# Patient Record
Sex: Male | Born: 1980
Health system: Southern US, Community
[De-identification: ages and names within clinical notes are randomized; demographics above are authoritative.]

## PROBLEM LIST (undated history)

## (undated) DIAGNOSIS — K219 Gastro-esophageal reflux disease without esophagitis: Secondary | ICD-10-CM

## (undated) DIAGNOSIS — M1990 Unspecified osteoarthritis, unspecified site: Secondary | ICD-10-CM

## (undated) HISTORY — PX: WISDOM TOOTH EXTRACTION: SHX21

---

## 2006-06-24 ENCOUNTER — Emergency Department (HOSPITAL_COMMUNITY): Admission: EM | Admit: 2006-06-24 | Discharge: 2006-06-24 | Payer: Self-pay | Admitting: Emergency Medicine
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
## 2016-10-28 DIAGNOSIS — J329 Chronic sinusitis, unspecified: Secondary | ICD-10-CM | POA: Diagnosis not present

## 2017-03-10 DIAGNOSIS — J019 Acute sinusitis, unspecified: Secondary | ICD-10-CM | POA: Diagnosis not present

## 2017-03-15 DIAGNOSIS — R200 Anesthesia of skin: Secondary | ICD-10-CM | POA: Diagnosis not present

## 2017-03-15 DIAGNOSIS — J329 Chronic sinusitis, unspecified: Secondary | ICD-10-CM | POA: Diagnosis not present

## 2017-03-15 DIAGNOSIS — G500 Trigeminal neuralgia: Secondary | ICD-10-CM | POA: Diagnosis not present

## 2017-03-15 DIAGNOSIS — F17210 Nicotine dependence, cigarettes, uncomplicated: Secondary | ICD-10-CM | POA: Diagnosis not present

## 2017-03-26 DIAGNOSIS — R200 Anesthesia of skin: Secondary | ICD-10-CM | POA: Diagnosis not present

## 2017-03-26 DIAGNOSIS — H6121 Impacted cerumen, right ear: Secondary | ICD-10-CM | POA: Diagnosis not present

## 2017-04-03 DIAGNOSIS — R200 Anesthesia of skin: Secondary | ICD-10-CM | POA: Diagnosis not present

## 2017-08-29 DIAGNOSIS — K649 Unspecified hemorrhoids: Secondary | ICD-10-CM | POA: Diagnosis not present

## 2017-09-06 DIAGNOSIS — Z0000 Encounter for general adult medical examination without abnormal findings: Secondary | ICD-10-CM | POA: Diagnosis not present

## 2017-12-24 DIAGNOSIS — R200 Anesthesia of skin: Secondary | ICD-10-CM | POA: Diagnosis not present

## 2017-12-24 DIAGNOSIS — M5412 Radiculopathy, cervical region: Secondary | ICD-10-CM | POA: Diagnosis not present

## 2017-12-24 DIAGNOSIS — M542 Cervicalgia: Secondary | ICD-10-CM | POA: Diagnosis not present

## 2018-01-05 DIAGNOSIS — M5412 Radiculopathy, cervical region: Secondary | ICD-10-CM | POA: Diagnosis not present

## 2018-01-13 DIAGNOSIS — M542 Cervicalgia: Secondary | ICD-10-CM | POA: Diagnosis not present

## 2018-01-13 DIAGNOSIS — R29898 Other symptoms and signs involving the musculoskeletal system: Secondary | ICD-10-CM | POA: Diagnosis not present

## 2018-01-13 DIAGNOSIS — M4722 Other spondylosis with radiculopathy, cervical region: Secondary | ICD-10-CM | POA: Diagnosis not present

## 2018-01-13 DIAGNOSIS — M5030 Other cervical disc degeneration, unspecified cervical region: Secondary | ICD-10-CM | POA: Diagnosis not present

## 2018-01-13 DIAGNOSIS — M5412 Radiculopathy, cervical region: Secondary | ICD-10-CM | POA: Diagnosis not present

## 2018-01-21 ENCOUNTER — Other Ambulatory Visit: Payer: Self-pay | Admitting: Neurosurgery

## 2018-01-21 DIAGNOSIS — M5412 Radiculopathy, cervical region: Secondary | ICD-10-CM | POA: Diagnosis not present

## 2018-01-21 DIAGNOSIS — M5030 Other cervical disc degeneration, unspecified cervical region: Secondary | ICD-10-CM | POA: Diagnosis not present

## 2018-01-21 DIAGNOSIS — M5020 Other cervical disc displacement, unspecified cervical region: Secondary | ICD-10-CM | POA: Diagnosis not present

## 2018-01-21 DIAGNOSIS — M542 Cervicalgia: Secondary | ICD-10-CM | POA: Diagnosis not present

## 2018-01-21 DIAGNOSIS — M4722 Other spondylosis with radiculopathy, cervical region: Secondary | ICD-10-CM | POA: Diagnosis not present

## 2018-02-05 ENCOUNTER — Other Ambulatory Visit (HOSPITAL_COMMUNITY): Payer: Self-pay

## 2018-02-05 NOTE — Pre-Procedure Instructions (Signed)
Ricardo PernaKevin P Williamson  02/05/2018    Your procedure is scheduled on Thursday, February 12, 2018 at 7:30 AM.   [facility]" Admitting Office at 5:30 AM.   Call this number if you have problems the morning of surgery: [phone]   Questions prior to day of surgery, please call [phone] between 8 & 4 PM.   Remember:  Do not eat food or drink liquids after midnight Wednesday, 02/11/18.  Take these medicines the morning of surgery with A SIP OF WATER: Tylenol - if needed  Stop NSAIDS (Celebrex, Aleve, Ibuprofen, etc) as of today. Do not use Aspirin products prior to surgery.   Do not wear jewelry.  Do not wear lotions, powders, cologne or deodorant.  Men may shave face and neck.  Do not bring valuables to the hospital.  [facility] is not responsible for any belongings or valuables.  Contacts, dentures or bridgework may not be worn into surgery.  Leave your suitcase in the car.  After surgery it may be brought to your room.  For patients admitted to the hospital, discharge time will be determined by your treatment team.  [facility] Pre[facility]  Before surgery, you can play an important role.  Because skin is not sterile, your skin needs to be as free of germs as possible.  You can reduce the number of germs on you skin by washing with CHG (chlorahexidine gluconate) soap before surgery.  CHG is an antiseptic cleaner which kills germs and bonds with the skin to continue killing germs even after washing.  Please DO NOT use if you have an allergy to CHG or antibacterial soaps.  If your skin becomes reddened/irritated stop using the CHG and inform your nurse when you arrive at Short Stay.  Do not shave (including legs and underarms) for at least 48 hours prior to the first CHG shower.  You may shave your face.  Please follow these instructions carefully:   1.  Shower with CHG Soap the night before surgery and the                    morning of  Surgery.  2.  If you choose to wash your hair, wash your hair first as usual with your       normal shampoo.  3.  After you shampoo, rinse your hair and body thoroughly to remove the shampoo.  4.  Use CHG as you would any other liquid soap.  You can apply chg directly       to the skin and wash gently with scrungie or a clean washcloth.  5.  Apply the CHG Soap to your body ONLY FROM THE NECK DOWN.        Do not use on open wounds or open sores.  Avoid contact with your eyes, ears, mouth and genitals (private parts).  Wash genitals (private parts) with your normal soap.  6.  Wash thoroughly, paying special attention to the area where your surgery        will be performed.  7.  Thoroughly rinse your body with warm water from the neck down.  8.  DO NOT shower/wash with your normal soap after using and rinsing off       the CHG Soap.  9.  Pat yourself dry with a clean towel.            10.  Wear clean pajamas.  11.  Place clean sheets on your bed the night of your first shower and do not        sleep with pets.  Day of Surgery  Shower as above. Do not apply any lotions/deodorants the morning of surgery.  Please wear clean clothes to the hospital.   Please read over the fact sheets that you were given.

## 2018-02-06 ENCOUNTER — Encounter (HOSPITAL_COMMUNITY)
Admission: RE | Admit: 2018-02-06 | Discharge: 2018-02-06 | Disposition: A | Discharge status: Home / Self Care | Payer: BLUE CROSS/BLUE SHIELD | Source: Ambulatory Visit | Attending: Neurosurgery | Admitting: Neurosurgery

## 2018-02-06 ENCOUNTER — Other Ambulatory Visit (HOSPITAL_COMMUNITY): Payer: Self-pay | Admitting: *Deleted

## 2018-02-06 ENCOUNTER — Other Ambulatory Visit: Payer: Self-pay

## 2018-02-06 ENCOUNTER — Encounter (HOSPITAL_COMMUNITY): Payer: Self-pay

## 2018-02-06 DIAGNOSIS — M47892 Other spondylosis, cervical region: Secondary | ICD-10-CM | POA: Insufficient documentation

## 2018-02-06 DIAGNOSIS — M5030 Other cervical disc degeneration, unspecified cervical region: Secondary | ICD-10-CM | POA: Diagnosis not present

## 2018-02-06 DIAGNOSIS — Z01818 Encounter for other preprocedural examination: Secondary | ICD-10-CM | POA: Diagnosis not present

## 2018-02-06 DIAGNOSIS — M5020 Other cervical disc displacement, unspecified cervical region: Secondary | ICD-10-CM | POA: Diagnosis not present

## 2018-02-06 HISTORY — DX: Gastro-esophageal reflux disease without esophagitis: K21.9

## 2018-02-06 HISTORY — DX: Unspecified osteoarthritis, unspecified site: M19.90

## 2018-02-06 LAB — COMPREHENSIVE METABOLIC PANEL
ALK PHOS: 70 U/L (ref 38–126)
ALT: 65 U/L — ABNORMAL HIGH (ref 17–63)
ANION GAP: 15 (ref 5–15)
AST: 101 U/L — ABNORMAL HIGH (ref 15–41)
Albumin: 4.6 g/dL (ref 3.5–5.0)
BUN: <5 mg/dL — ABNORMAL LOW (ref 6–20)
CALCIUM: 9 mg/dL (ref 8.9–10.3)
CO2: 21 mmol/L — AB (ref 22–32)
Chloride: 101 mmol/L (ref 101–111)
Creatinine, Ser: 0.73 mg/dL (ref 0.61–1.24)
Glucose, Bld: 91 mg/dL (ref 65–99)
Potassium: 4.1 mmol/L (ref 3.5–5.1)
SODIUM: 137 mmol/L (ref 135–145)
TOTAL PROTEIN: 7.7 g/dL (ref 6.5–8.1)
Total Bilirubin: 0.7 mg/dL (ref 0.3–1.2)

## 2018-02-06 LAB — TYPE AND SCREEN
ABO/RH(D): O POS
Antibody Screen: NEGATIVE

## 2018-02-06 LAB — SURGICAL PCR SCREEN
MRSA, PCR: NEGATIVE
Staphylococcus aureus: NEGATIVE

## 2018-02-06 LAB — CBC
HEMATOCRIT: 44.7 % (ref 39.0–52.0)
Hemoglobin: 15.3 g/dL (ref 13.0–17.0)
MCH: 32.3 pg (ref 26.0–34.0)
MCHC: 34.2 g/dL (ref 30.0–36.0)
MCV: 94.5 fL (ref 78.0–100.0)
PLATELETS: 208 10*3/uL (ref 150–400)
RBC: 4.73 MIL/uL (ref 4.22–5.81)
RDW: 13.7 % (ref 11.5–15.5)
WBC: 7.7 10*3/uL (ref 4.0–10.5)

## 2018-02-06 LAB — ABO/RH: ABO/RH(D): O POS

## 2018-02-06 NOTE — Progress Notes (Signed)
Pt and wife deny cardiac history, HTN or Diabetes. Pt very hyper, nervous in PAT appt. No known history of ADD or ADHD. Pt does state he drinks beer daily, 2-3 daily during the week and can be 6 a day on weekends.

## 2018-02-09 NOTE — Progress Notes (Addendum)
Anesthesia Chart Review:  Pt is a 37 year old male scheduled for C4-5, C5-6, C6-7 ACDF on 02/12/2018 with Jovita Gamma, MD  - PCP is Townsend Roger, MD  PMH includes:  GERD. Current smoker. BMI 22  Medications include: zantac  BP 122/67   Pulse 65   Temp 36.5 C   Resp 20   Ht '5\' 11"'$  (1.803 m)   Wt 156 lb 1.6 oz (70.8 kg)   SpO2 100%   BMI 21.77 kg/m   Preoperative labs reviewed.   - AST 101, ALT 65. Alk phos normal. Pt reportedly drinks 3 beers per day.  - Platelets normal - Reviewed labs will Dr. Valma Cava. No comparison labs available. Will repeat HFP and get coags day of surgery. I notified Nikki in Dr. Donnella Bi office and will route lab results to PCP.   EKG not required based on history.   If no changes, I anticipate pt can proceed with surgery as scheduled.   Willeen Cass, FNP-BC [facility] Surgical [facility]/Anesthesiology Phone: [phone] 02/09/2018 2:54 PM

## 2018-02-10 ENCOUNTER — Other Ambulatory Visit: Payer: Self-pay | Admitting: Neurosurgery

## 2018-02-12 ENCOUNTER — Encounter (HOSPITAL_COMMUNITY): Payer: Self-pay | Admitting: *Deleted

## 2018-02-12 ENCOUNTER — Inpatient Hospital Stay (HOSPITAL_COMMUNITY): Payer: BLUE CROSS/BLUE SHIELD

## 2018-02-12 ENCOUNTER — Inpatient Hospital Stay (HOSPITAL_COMMUNITY): Payer: BLUE CROSS/BLUE SHIELD | Admitting: Emergency Medicine

## 2018-02-12 ENCOUNTER — Inpatient Hospital Stay (HOSPITAL_COMMUNITY)
Admission: RE | Disposition: A | Discharge status: Home / Self Care | Payer: Self-pay | Source: Ambulatory Visit | Attending: Neurosurgery

## 2018-02-12 ENCOUNTER — Inpatient Hospital Stay (HOSPITAL_COMMUNITY)
Admission: RE | Admit: 2018-02-12 | Discharge: 2018-02-13 | DRG: 473 | Disposition: A | Discharge status: Home / Self Care | Payer: BLUE CROSS/BLUE SHIELD | Source: Ambulatory Visit | Attending: Neurosurgery | Admitting: Neurosurgery

## 2018-02-12 ENCOUNTER — Other Ambulatory Visit: Payer: Self-pay

## 2018-02-12 DIAGNOSIS — M50121 Cervical disc disorder at C4-C5 level with radiculopathy: Principal | ICD-10-CM | POA: Diagnosis present

## 2018-02-12 DIAGNOSIS — M4722 Other spondylosis with radiculopathy, cervical region: Secondary | ICD-10-CM | POA: Diagnosis present

## 2018-02-12 DIAGNOSIS — M6281 Muscle weakness (generalized): Secondary | ICD-10-CM | POA: Diagnosis present

## 2018-02-12 DIAGNOSIS — Z791 Long term (current) use of non-steroidal anti-inflammatories (NSAID): Secondary | ICD-10-CM | POA: Diagnosis not present

## 2018-02-12 DIAGNOSIS — M50122 Cervical disc disorder at C5-C6 level with radiculopathy: Secondary | ICD-10-CM | POA: Diagnosis not present

## 2018-02-12 DIAGNOSIS — M4322 Fusion of spine, cervical region: Secondary | ICD-10-CM | POA: Diagnosis not present

## 2018-02-12 DIAGNOSIS — K219 Gastro-esophageal reflux disease without esophagitis: Secondary | ICD-10-CM | POA: Diagnosis present

## 2018-02-12 DIAGNOSIS — M5020 Other cervical disc displacement, unspecified cervical region: Secondary | ICD-10-CM | POA: Diagnosis present

## 2018-02-12 DIAGNOSIS — F17210 Nicotine dependence, cigarettes, uncomplicated: Secondary | ICD-10-CM | POA: Diagnosis not present

## 2018-02-12 DIAGNOSIS — F17290 Nicotine dependence, other tobacco product, uncomplicated: Secondary | ICD-10-CM | POA: Diagnosis not present

## 2018-02-12 DIAGNOSIS — Z419 Encounter for procedure for purposes other than remedying health state, unspecified: Secondary | ICD-10-CM

## 2018-02-12 DIAGNOSIS — Z981 Arthrodesis status: Secondary | ICD-10-CM | POA: Diagnosis not present

## 2018-02-12 DIAGNOSIS — Z79899 Other long term (current) drug therapy: Secondary | ICD-10-CM | POA: Diagnosis not present

## 2018-02-12 DIAGNOSIS — M50123 Cervical disc disorder at C6-C7 level with radiculopathy: Secondary | ICD-10-CM | POA: Diagnosis not present

## 2018-02-12 HISTORY — PX: ANTERIOR CERVICAL DECOMP/DISCECTOMY FUSION: SHX1161

## 2018-02-12 LAB — HEPATIC FUNCTION PANEL
ALT: 46 U/L (ref 17–63)
AST: 62 U/L — AB (ref 15–41)
Albumin: 4.4 g/dL (ref 3.5–5.0)
Alkaline Phosphatase: 67 U/L (ref 38–126)
BILIRUBIN DIRECT: 0.1 mg/dL (ref 0.1–0.5)
BILIRUBIN TOTAL: 0.6 mg/dL (ref 0.3–1.2)
Indirect Bilirubin: 0.5 mg/dL (ref 0.3–0.9)
Total Protein: 7.2 g/dL (ref 6.5–8.1)

## 2018-02-12 LAB — PROTIME-INR
INR: 0.92
Prothrombin Time: 12.3 seconds (ref 11.4–15.2)

## 2018-02-12 LAB — APTT: aPTT: 29 seconds (ref 24–36)

## 2018-02-12 SURGERY — ANTERIOR CERVICAL DECOMPRESSION/DISCECTOMY FUSION 3 LEVELS
Anesthesia: General

## 2018-02-12 MED ORDER — PROPOFOL 10 MG/ML IV BOLUS
INTRAVENOUS | Status: DC | PRN
  Administered 2018-02-12: 200 mg via INTRAVENOUS

## 2018-02-12 MED ORDER — BUPIVACAINE HCL (PF) 0.5 % IJ SOLN
INTRAMUSCULAR | Status: DC | PRN
  Administered 2018-02-12: 10 mL

## 2018-02-12 MED ORDER — LACTATED RINGERS IV SOLN
INTRAVENOUS | Status: DC | PRN
  Administered 2018-02-12: 08:00:00 via INTRAVENOUS

## 2018-02-12 MED ORDER — FENTANYL CITRATE (PF) 250 MCG/5ML IJ SOLN
INTRAMUSCULAR | Status: AC
  Filled 2018-02-12: qty 5

## 2018-02-12 MED ORDER — MORPHINE SULFATE (PF) 4 MG/ML IV SOLN: 4.0000 mg | INTRAVENOUS | Status: DC | PRN

## 2018-02-12 MED ORDER — CEFAZOLIN SODIUM-DEXTROSE 2-4 GM/100ML-% IV SOLN
INTRAVENOUS | Status: AC
  Filled 2018-02-12: qty 100

## 2018-02-12 MED ORDER — ACETAMINOPHEN 650 MG RE SUPP: 650.0000 mg | RECTAL | Status: DC | PRN

## 2018-02-12 MED ORDER — MIDAZOLAM HCL 2 MG/2ML IJ SOLN
INTRAMUSCULAR | Status: AC
  Filled 2018-02-12: qty 2

## 2018-02-12 MED ORDER — KETOROLAC TROMETHAMINE 30 MG/ML IJ SOLN
30.0000 mg | Freq: Once | INTRAMUSCULAR | Status: AC
  Administered 2018-02-12: 30 mg via INTRAVENOUS

## 2018-02-12 MED ORDER — CHLORHEXIDINE GLUCONATE CLOTH 2 % EX PADS: 6.0000 | MEDICATED_PAD | Freq: Once | CUTANEOUS | Status: DC

## 2018-02-12 MED ORDER — LIDOCAINE-EPINEPHRINE 1 %-1:100000 IJ SOLN
INTRAMUSCULAR | Status: AC
  Filled 2018-02-12: qty 1

## 2018-02-12 MED ORDER — DEXAMETHASONE SODIUM PHOSPHATE 10 MG/ML IJ SOLN
INTRAMUSCULAR | Status: DC | PRN
  Administered 2018-02-12: 10 mg via INTRAVENOUS

## 2018-02-12 MED ORDER — LIDOCAINE 2% (20 MG/ML) 5 ML SYRINGE
INTRAMUSCULAR | Status: DC | PRN
  Administered 2018-02-12: 80 mg via INTRAVENOUS

## 2018-02-12 MED ORDER — HYDROCODONE-ACETAMINOPHEN 5-325 MG PO TABS
1.0000 | ORAL_TABLET | ORAL | Status: DC | PRN
  Administered 2018-02-13: 2 via ORAL
  Filled 2018-02-12: qty 2

## 2018-02-12 MED ORDER — MAGNESIUM HYDROXIDE 400 MG/5ML PO SUSP: 30.0000 mL | Freq: Every day | ORAL | Status: DC | PRN

## 2018-02-12 MED ORDER — SUGAMMADEX SODIUM 200 MG/2ML IV SOLN
INTRAVENOUS | Status: DC | PRN
  Administered 2018-02-12: 200 mg via INTRAVENOUS

## 2018-02-12 MED ORDER — MIDAZOLAM HCL 5 MG/5ML IJ SOLN
INTRAMUSCULAR | Status: DC | PRN
  Administered 2018-02-12: 2 mg via INTRAVENOUS

## 2018-02-12 MED ORDER — OXYCODONE HCL 5 MG/5ML PO SOLN: 5.0000 mg | Freq: Once | ORAL | Status: DC | PRN

## 2018-02-12 MED ORDER — ALUM & MAG HYDROXIDE-SIMETH 200-200-20 MG/5ML PO SUSP: 30.0000 mL | Freq: Four times a day (QID) | ORAL | Status: DC | PRN

## 2018-02-12 MED ORDER — ONDANSETRON HCL 4 MG/2ML IJ SOLN: 4.0000 mg | Freq: Four times a day (QID) | INTRAMUSCULAR | Status: DC | PRN

## 2018-02-12 MED ORDER — SODIUM CHLORIDE 0.9% FLUSH
3.0000 mL | Freq: Two times a day (BID) | INTRAVENOUS | Status: DC
  Administered 2018-02-12: 3 mL via INTRAVENOUS

## 2018-02-12 MED ORDER — THROMBIN (RECOMBINANT) 5000 UNITS EX SOLR
CUTANEOUS | Status: DC | PRN
  Administered 2018-02-12: 08:00:00 via TOPICAL

## 2018-02-12 MED ORDER — BISACODYL 10 MG RE SUPP: 10.0000 mg | Freq: Every day | RECTAL | Status: DC | PRN

## 2018-02-12 MED ORDER — HYDROXYZINE HCL 25 MG PO TABS: 50.0000 mg | ORAL_TABLET | ORAL | Status: DC | PRN

## 2018-02-12 MED ORDER — 0.9 % SODIUM CHLORIDE (POUR BTL) OPTIME
TOPICAL | Status: DC | PRN
  Administered 2018-02-12: 1000 mL

## 2018-02-12 MED ORDER — ROCURONIUM BROMIDE 10 MG/ML (PF) SYRINGE
PREFILLED_SYRINGE | INTRAVENOUS | Status: DC | PRN
  Administered 2018-02-12: 30 mg via INTRAVENOUS
  Administered 2018-02-12 (×2): 50 mg via INTRAVENOUS
  Administered 2018-02-12: 30 mg via INTRAVENOUS

## 2018-02-12 MED ORDER — SUGAMMADEX SODIUM 200 MG/2ML IV SOLN
INTRAVENOUS | Status: AC
  Filled 2018-02-12: qty 2

## 2018-02-12 MED ORDER — CEFAZOLIN SODIUM-DEXTROSE 2-4 GM/100ML-% IV SOLN
2.0000 g | INTRAVENOUS | Status: AC
  Administered 2018-02-12: 2 g via INTRAVENOUS

## 2018-02-12 MED ORDER — FLEET ENEMA 7-19 GM/118ML RE ENEM: 1.0000 | ENEMA | Freq: Once | RECTAL | Status: DC | PRN

## 2018-02-12 MED ORDER — SODIUM CHLORIDE 0.9 % IV SOLN: 250.0000 mL | INTRAVENOUS | Status: DC

## 2018-02-12 MED ORDER — LIDOCAINE-EPINEPHRINE 1 %-1:100000 IJ SOLN
INTRAMUSCULAR | Status: DC | PRN
  Administered 2018-02-12: 10 mL

## 2018-02-12 MED ORDER — FENTANYL CITRATE (PF) 250 MCG/5ML IJ SOLN
INTRAMUSCULAR | Status: DC | PRN
  Administered 2018-02-12 (×5): 50 ug via INTRAVENOUS
  Administered 2018-02-12: 100 ug via INTRAVENOUS
  Administered 2018-02-12 (×3): 50 ug via INTRAVENOUS

## 2018-02-12 MED ORDER — DEXAMETHASONE SODIUM PHOSPHATE 10 MG/ML IJ SOLN
INTRAMUSCULAR | Status: AC
  Filled 2018-02-12: qty 1

## 2018-02-12 MED ORDER — THROMBIN 5000 UNITS EX SOLR
CUTANEOUS | Status: AC
  Filled 2018-02-12: qty 5000

## 2018-02-12 MED ORDER — SODIUM CHLORIDE 0.9% FLUSH: 3.0000 mL | INTRAVENOUS | Status: DC | PRN

## 2018-02-12 MED ORDER — PHENYLEPHRINE 40 MCG/ML (10ML) SYRINGE FOR IV PUSH (FOR BLOOD PRESSURE SUPPORT)
PREFILLED_SYRINGE | INTRAVENOUS | Status: AC
  Filled 2018-02-12: qty 10

## 2018-02-12 MED ORDER — ONDANSETRON HCL 4 MG/2ML IJ SOLN
INTRAMUSCULAR | Status: DC | PRN
  Administered 2018-02-12: 4 mg via INTRAVENOUS

## 2018-02-12 MED ORDER — PHENOL 1.4 % MT LIQD
1.0000 | OROMUCOSAL | Status: DC | PRN
  Administered 2018-02-13: 1 via OROMUCOSAL
  Filled 2018-02-12: qty 177

## 2018-02-12 MED ORDER — ROCURONIUM BROMIDE 10 MG/ML (PF) SYRINGE
PREFILLED_SYRINGE | INTRAVENOUS | Status: AC
  Filled 2018-02-12: qty 5

## 2018-02-12 MED ORDER — PROPOFOL 10 MG/ML IV BOLUS
INTRAVENOUS | Status: AC
  Filled 2018-02-12: qty 20

## 2018-02-12 MED ORDER — KETOROLAC TROMETHAMINE 30 MG/ML IJ SOLN
30.0000 mg | Freq: Four times a day (QID) | INTRAMUSCULAR | Status: DC
  Administered 2018-02-12 – 2018-02-13 (×3): 30 mg via INTRAVENOUS
  Filled 2018-02-12 (×3): qty 1

## 2018-02-12 MED ORDER — OXYCODONE HCL 5 MG PO TABS
5.0000 mg | ORAL_TABLET | Freq: Once | ORAL | Status: DC | PRN
Start: 2018-02-12 — End: 2018-02-12

## 2018-02-12 MED ORDER — ACETAMINOPHEN 325 MG PO TABS: 650.0000 mg | ORAL_TABLET | ORAL | Status: DC | PRN

## 2018-02-12 MED ORDER — PHENYLEPHRINE HCL 10 MG/ML IJ SOLN: INTRAMUSCULAR | Status: DC | PRN

## 2018-02-12 MED ORDER — HYDROMORPHONE HCL 2 MG/ML IJ SOLN: 0.3000 mg | INTRAMUSCULAR | Status: DC | PRN

## 2018-02-12 MED ORDER — FENTANYL CITRATE (PF) 250 MCG/5ML IJ SOLN
INTRAMUSCULAR | Status: AC
Start: 2018-02-12 — End: 2018-02-12
  Filled 2018-02-12: qty 5

## 2018-02-12 MED ORDER — DEXTROSE 5 % IV SOLN
INTRAVENOUS | Status: DC | PRN
  Administered 2018-02-12: 25 ug/min via INTRAVENOUS

## 2018-02-12 MED ORDER — KETOROLAC TROMETHAMINE 30 MG/ML IJ SOLN
INTRAMUSCULAR | Status: AC
  Filled 2018-02-12: qty 1

## 2018-02-12 MED ORDER — ACETAMINOPHEN 10 MG/ML IV SOLN
INTRAVENOUS | Status: DC | PRN
  Administered 2018-02-12: 1000 mg via INTRAVENOUS

## 2018-02-12 MED ORDER — ACETAMINOPHEN 10 MG/ML IV SOLN
INTRAVENOUS | Status: AC
  Filled 2018-02-12: qty 100

## 2018-02-12 MED ORDER — PHENYLEPHRINE 40 MCG/ML (10ML) SYRINGE FOR IV PUSH (FOR BLOOD PRESSURE SUPPORT)
PREFILLED_SYRINGE | INTRAVENOUS | Status: DC | PRN
  Administered 2018-02-12: 30 ug via INTRAVENOUS
  Administered 2018-02-12 (×2): 80 ug via INTRAVENOUS

## 2018-02-12 MED ORDER — KCL IN DEXTROSE-NACL 20-5-0.45 MEQ/L-%-% IV SOLN: INTRAVENOUS | Status: DC

## 2018-02-12 MED ORDER — LACTATED RINGERS IV SOLN
INTRAVENOUS | Status: DC | PRN
  Administered 2018-02-12 (×2): via INTRAVENOUS

## 2018-02-12 MED ORDER — BUPIVACAINE HCL (PF) 0.5 % IJ SOLN
INTRAMUSCULAR | Status: AC
  Filled 2018-02-12: qty 30

## 2018-02-12 MED ORDER — SURGIFOAM 100 EX MISC
CUTANEOUS | Status: DC | PRN
  Administered 2018-02-12: 09:00:00 via TOPICAL

## 2018-02-12 MED ORDER — MENTHOL 3 MG MT LOZG: 1.0000 | LOZENGE | OROMUCOSAL | Status: DC | PRN

## 2018-02-12 MED ORDER — HYDROXYZINE HCL 50 MG/ML IM SOLN: 50.0000 mg | INTRAMUSCULAR | Status: DC | PRN

## 2018-02-12 MED ORDER — ONDANSETRON HCL 4 MG/2ML IJ SOLN
INTRAMUSCULAR | Status: AC
  Filled 2018-02-12: qty 2

## 2018-02-12 MED ORDER — LIDOCAINE 2% (20 MG/ML) 5 ML SYRINGE
INTRAMUSCULAR | Status: AC
  Filled 2018-02-12: qty 5

## 2018-02-12 MED ORDER — SODIUM CHLORIDE 0.9 % IV SOLN
INTRAVENOUS | Status: DC | PRN
  Administered 2018-02-12: 08:00:00

## 2018-02-12 MED ORDER — CYCLOBENZAPRINE HCL 5 MG PO TABS: 5.0000 mg | ORAL_TABLET | Freq: Three times a day (TID) | ORAL | Status: DC | PRN

## 2018-02-12 MED ORDER — THROMBIN 20000 UNITS EX SOLR
CUTANEOUS | Status: AC
  Filled 2018-02-12: qty 20000

## 2018-02-12 SURGICAL SUPPLY — 66 items
ADH SKN CLS APL DERMABOND .7 (GAUZE/BANDAGES/DRESSINGS) ×1
ALLOGRAFT 7X14X11 (Bone Implant) ×3 IMPLANT
BAG DECANTER FOR FLEXI CONT (MISCELLANEOUS) ×2 IMPLANT
BIT DRILL 13 (BIT) ×1 IMPLANT
BIT DRILL NEURO 2X3.1 SFT TUCH (MISCELLANEOUS) ×1 IMPLANT
BLADE ULTRA TIP 2M (BLADE) IMPLANT
BUR MATCHSTICK NEURO 3.0 LAGG (BURR) IMPLANT
CANISTER SUCT 3000ML PPV (MISCELLANEOUS) ×2 IMPLANT
CARTRIDGE OIL MAESTRO DRILL (MISCELLANEOUS) ×1 IMPLANT
CLIP VESOCCLUDE MED 6/CT (CLIP) ×1 IMPLANT
COVER MAYO STAND STRL (DRAPES) ×2 IMPLANT
DECANTER SPIKE VIAL GLASS SM (MISCELLANEOUS) ×2 IMPLANT
DERMABOND ADVANCED (GAUZE/BANDAGES/DRESSINGS) ×1
DERMABOND ADVANCED .7 DNX12 (GAUZE/BANDAGES/DRESSINGS) ×1 IMPLANT
DIFFUSER DRILL AIR PNEUMATIC (MISCELLANEOUS) ×2 IMPLANT
DRAPE HALF SHEET 40X57 (DRAPES) IMPLANT
DRAPE LAPAROTOMY 100X72 PEDS (DRAPES) ×2 IMPLANT
DRAPE MICROSCOPE LEICA (MISCELLANEOUS) ×2 IMPLANT
DRAPE POUCH INSTRU U-SHP 10X18 (DRAPES) ×2 IMPLANT
DRILL NEURO 2X3.1 SOFT TOUCH (MISCELLANEOUS) ×4
ELECT COATED BLADE 2.86 ST (ELECTRODE) ×2 IMPLANT
ELECT REM PT RETURN 9FT ADLT (ELECTROSURGICAL) ×2
ELECTRODE REM PT RTRN 9FT ADLT (ELECTROSURGICAL) ×1 IMPLANT
GAUZE SPONGE 4X4 12PLY STRL LF (GAUZE/BANDAGES/DRESSINGS) IMPLANT
GAUZE SPONGE 4X4 16PLY XRAY LF (GAUZE/BANDAGES/DRESSINGS) ×1 IMPLANT
GLOVE BIOGEL PI IND STRL 8 (GLOVE) ×1 IMPLANT
GLOVE BIOGEL PI INDICATOR 8 (GLOVE) ×2
GLOVE ECLIPSE 7.5 STRL STRAW (GLOVE) ×3 IMPLANT
GLOVE EXAM NITRILE LRG STRL (GLOVE) IMPLANT
GLOVE EXAM NITRILE XL STR (GLOVE) IMPLANT
GLOVE EXAM NITRILE XS STR PU (GLOVE) IMPLANT
GOWN STRL REUS W/ TWL LRG LVL3 (GOWN DISPOSABLE) IMPLANT
GOWN STRL REUS W/ TWL XL LVL3 (GOWN DISPOSABLE) ×1 IMPLANT
GOWN STRL REUS W/TWL 2XL LVL3 (GOWN DISPOSABLE) ×1 IMPLANT
GOWN STRL REUS W/TWL LRG LVL3 (GOWN DISPOSABLE) ×2
GOWN STRL REUS W/TWL XL LVL3 (GOWN DISPOSABLE) ×2
HALTER HD/CHIN CERV TRACTION D (MISCELLANEOUS) ×2 IMPLANT
HEMOSTAT POWDER KIT SURGIFOAM (HEMOSTASIS) ×2 IMPLANT
KIT BASIN OR (CUSTOM PROCEDURE TRAY) ×2 IMPLANT
KIT TURNOVER KIT B (KITS) ×2 IMPLANT
NDL HYPO 25X1 1.5 SAFETY (NEEDLE) ×1 IMPLANT
NDL SPNL 22GX3.5 QUINCKE BK (NEEDLE) ×1 IMPLANT
NEEDLE HYPO 25X1 1.5 SAFETY (NEEDLE) ×2 IMPLANT
NEEDLE SPNL 22GX3.5 QUINCKE BK (NEEDLE) ×2 IMPLANT
NS IRRIG 1000ML POUR BTL (IV SOLUTION) ×2 IMPLANT
OIL CARTRIDGE MAESTRO DRILL (MISCELLANEOUS) ×2
PACK LAMINECTOMY NEURO (CUSTOM PROCEDURE TRAY) ×2 IMPLANT
PAD ARMBOARD 7.5X6 YLW CONV (MISCELLANEOUS) ×6 IMPLANT
PATTIES SURGICAL 1X1 (DISPOSABLE) ×1 IMPLANT
PLATE 3 57.5XLCK NS SPNE CVD (Plate) IMPLANT
PLATE 3 ATLANTIS TRANS (Plate) ×2 IMPLANT
RUBBERBAND STERILE (MISCELLANEOUS) ×4 IMPLANT
SCREW ST 15X4XST FXANG NS (Screw) IMPLANT
SCREW ST 16X4XST FXANG NS (Screw) IMPLANT
SCREW ST FIX 4 ATL (Screw) ×16 IMPLANT
SPONGE INTESTINAL PEANUT (DISPOSABLE) ×3 IMPLANT
SPONGE SURGIFOAM ABS GEL 100 (HEMOSTASIS) ×2 IMPLANT
STAPLER SKIN PROX WIDE 3.9 (STAPLE) ×1 IMPLANT
SUT VIC AB 0 CT1 18XCR BRD8 (SUTURE) IMPLANT
SUT VIC AB 0 CT1 8-18 (SUTURE)
SUT VIC AB 2-0 CP2 18 (SUTURE) ×2 IMPLANT
SUT VIC AB 3-0 SH 8-18 (SUTURE) ×2 IMPLANT
TAPE CLOTH SURG 4X10 WHT LF (GAUZE/BANDAGES/DRESSINGS) ×1 IMPLANT
TOWEL GREEN STERILE (TOWEL DISPOSABLE) ×2 IMPLANT
TOWEL GREEN STERILE FF (TOWEL DISPOSABLE) ×1 IMPLANT
WATER STERILE IRR 1000ML POUR (IV SOLUTION) ×2 IMPLANT

## 2018-02-12 NOTE — Op Note (Signed)
02/12/2018  11:28 AM  PATIENT:  Ricardo Williamson  37 y.o. male  PRE-OPERATIVE DIAGNOSIS:  Multilevel cervical disc herniation superimposed upon underlying cervical spondylosis and degenerative disc disease with mild cervical radiculopathy, left worse than right with left triceps weakness  POST-OPERATIVE DIAGNOSIS:  Multilevel cervical disc herniation superimposed upon underlying cervical spondylosis and degenerative disc disease with mild cervical radiculopathy, left worse than right with left triceps weakness  PROCEDURE:  Procedure(s): C4-5, C5-6, and C6-7 anterior cervical decompression and arthrodesis with structural allograft and Atlantis translational cervical plating  SURGEON: Shirlean Kelly, MD  ASSISTANTS: Coletta Memos, MD  ANESTHESIA:   general  EBL:  Total I/O In: 1900 [I.V.:1900] Out: 450 [Urine:300; Blood:150]  BLOOD ADMINISTERED:none  COUNT:  Correct per nursing staff  DICTATION: Patient was brought to the operating room placed under general endotracheal anesthesia. Patient was placed in 10 pounds of halter traction. The neck was prepped with Betadine soap and solution and draped in a sterile fashion. An obliquel incision was made on the left side of the neck paralleling the anterior border of the sternocleidomastoid.. The line of the incision was infiltrated with local anesthetic with epinephrine. Dissection was carried down thru the subcutaneous tissue and platysma, bipolar cautery was used to maintain hemostasis. Dissection was then carried out thru a plane leaving the sternocleidomastoid carotid artery and jugular vein laterally and the trachea and esophagus medially. The ventral aspect of the vertebral column was identified and a localizing x-ray was taken. The C4-5, C5-6, C6-7 levels were identified. The annulus at each level was incised and the disc space entered.  There was extensive anterior spinal overgrowth, particularly at the C6-7 level, that was carefully removed  with Kerrison punches, the osteophyte removal tool, and the high-speed drill.  Discectomy was performed with micro-curettes and pituitary rongeurs. The operating microscope was draped and brought into the field provided additional magnification illumination and visualization. Discectomy was continued posteriorly thru the disc space and then the cartilaginous endplate was removed using micro-curettes along with the high-speed drill. Posterior osteophytic overgrowth was removed at each level using the high-speed drill along with a 2 mm thin footplated Kerrison punch. Posterior longitudinal ligament along with disc herniation was carefully removed, decompressing the spinal canal and thecal sac. We then continued to remove osteophytic overgrowth and disc material decompressing the neural foramina and exiting nerve roots bilaterally. Once the decompression was completed hemostasis was established at each level with the use of Gelfoam with thrombin and bipolar cautery. The Gelfoam was removed, a thin layer of Surgifoam applied, the wound irrigated and hemostasis confirmed. We then measured the height of each intravertebral disc space level and selected a 7 millimeter in height structural allograft for each level. Each was hydrated in saline solution and then gently positioned in the intravertebral disc space and countersunk. We then selected a 57.5 millimeter in height Atlantis translational cervical plate. It was positioned over the fusion construct and secured to the vertebra with 4 mm fixed screws.  We used a pair of 15 mm screws bilaterally at C4 and C6 and a pair of 16 mm screws bilaterally at C5 and C7.  Each screw hole was started with the high-speed drill and then the screws placed, once all the screws were placed, the locking system was secured. The wound was irrigated with bacitracin solution checked for hemostasis which was established and confirmed. An x-ray was taken which showed the grafts in good position,  the plate and screws in good position, and the overall  alignment to be good. We then proceeded with closure. The platysma was closed with interrupted inverted 2-0 undyed Vicryl suture, the subcutaneous and subcuticular closed with interrupted inverted 3-0 undyed Vicryl suture. The skin edges were approximated with Dermabond. Following surgery the patient was taken out of cervical traction. To be reversed and the anesthetic and taken to the recovery room for further care.  PLAN OF CARE: Admit to inpatient   PATIENT DISPOSITION:  PACU - hemodynamically stable.   Delay start of Pharmacological VTE agent (>24hrs) due to surgical blood loss or risk of bleeding:  yes

## 2018-02-12 NOTE — Anesthesia Procedure Notes (Addendum)
Procedure Name: Intubation Date/Time: 02/12/2018 7:39 AM Performed by: Nils PyleBell, Jahnasia Tatum T, CRNA Pre-anesthesia Checklist: Patient identified, Emergency Drugs available, Suction available, Patient being monitored and Timeout performed Patient Re-evaluated:Patient Re-evaluated prior to induction Oxygen Delivery Method: Circle system utilized Preoxygenation: Pre-oxygenation with 100% oxygen Induction Type: IV induction Ventilation: Mask ventilation without difficulty Laryngoscope Size: Glidescope and 4 Grade View: Grade I Tube type: Oral Tube size: 7.5 mm Number of attempts: 1 Airway Equipment and Method: Patient positioned with wedge pillow,  Stylet and Video-laryngoscopy Placement Confirmation: ETT inserted through vocal cords under direct vision,  positive ETCO2 and breath sounds checked- equal and bilateral Secured at: 23 cm Tube secured with: Tape Dental Injury: Teeth and Oropharynx as per pre-operative assessment  Comments: Intubation by Eldridge AbrahamsSally Nelson, SRNA. Elective glidescope intubation due to cervical pain.

## 2018-02-12 NOTE — Anesthesia Preprocedure Evaluation (Addendum)
Anesthesia Evaluation  Patient identified by MRN, date of birth, ID band Patient awake    Reviewed: Allergy & Precautions, H&P , NPO status , Patient's Chart, lab work & pertinent test results  Airway Mallampati: II  TM Distance: >3 FB Neck ROM: full    Dental  (+) Teeth Intact, Dental Advisory Given   Pulmonary Current Smoker,    breath sounds clear to auscultation       Cardiovascular negative cardio ROS   Rhythm:regular Rate:Normal     Neuro/Psych    GI/Hepatic GERD  ,  Endo/Other    Renal/GU      Musculoskeletal  (+) Arthritis ,   Abdominal   Peds  Hematology   Anesthesia Other Findings   Reproductive/Obstetrics                            Anesthesia Physical Anesthesia Plan  ASA: II  Anesthesia Plan: General   Post-op Pain Management:    Induction: Intravenous  PONV Risk Score and Plan: 1 and Ondansetron, Dexamethasone, Midazolam and Treatment may vary due to age or medical condition  Airway Management Planned: Oral ETT  Additional Equipment:   Intra-op Plan:   Post-operative Plan: Extubation in OR  Informed Consent: I have reviewed the patients History and Physical, chart, labs and discussed the procedure including the risks, benefits and alternatives for the proposed anesthesia with the patient or authorized representative who has indicated his/her understanding and acceptance.     Plan Discussed with: CRNA, Anesthesiologist and Surgeon  Anesthesia Plan Comments:         Anesthesia Quick Evaluation

## 2018-02-12 NOTE — Anesthesia Postprocedure Evaluation (Signed)
Anesthesia Post Note  Patient: Ricardo PernaKevin P Williamson  Procedure(s) Performed: ANTERIOR CERVICAL DECOMPRESSION/DISCECTOMY FUSIONCERVICAL FOUR-FIVE, FIVE-SIX, SIX-SEVEN (N/A )     Patient location during evaluation: PACU Anesthesia Type: General Level of consciousness: awake and alert Pain management: pain level controlled Vital Signs Assessment: post-procedure vital signs reviewed and stable Respiratory status: spontaneous breathing, nonlabored ventilation, respiratory function stable and patient connected to nasal cannula oxygen Cardiovascular status: blood pressure returned to baseline and stable Postop Assessment: no apparent nausea or vomiting Anesthetic complications: no    Last Vitals:  Vitals:   02/12/18 1630 02/12/18 1700  BP: 134/83   Pulse: 95   Resp: 18   Temp: 37.5 C 37.1 C  SpO2: 98%     Last Pain:  Vitals:   02/12/18 1700  TempSrc: Oral  PainSc:                  Satsuki Zillmer S

## 2018-02-12 NOTE — Transfer of Care (Signed)
Immediate Anesthesia Transfer of Care Note  Patient: Ricardo PernaKevin P Williamson  Procedure(s) Performed: ANTERIOR CERVICAL DECOMPRESSION/DISCECTOMY FUSIONCERVICAL FOUR-FIVE, FIVE-SIX, SIX-SEVEN (N/A )  Patient Location: PACU  Anesthesia Type:General  Level of Consciousness: awake, alert  and oriented  Airway & Oxygen Therapy: Patient Spontanous Breathing and Patient connected to face mask oxygen  Post-op Assessment: Report given to RN, Post -op Vital signs reviewed and stable and Patient moving all extremities X 4  Post vital signs: Reviewed and stable  Last Vitals:  Vitals Value Taken Time  BP 141/85 02/12/2018 11:39 AM  Temp    Pulse 117 02/12/2018 11:41 AM  Resp 13 02/12/2018 11:41 AM  SpO2 100 % 02/12/2018 11:41 AM  Vitals shown include unvalidated device data.  Last Pain:  Vitals:   02/12/18 1140  TempSrc:   PainSc: (P) 7       Patients Stated Pain Goal: 4 (02/12/18 0622)  Complications: No apparent anesthesia complications

## 2018-02-12 NOTE — Progress Notes (Signed)
Vitals:   02/12/18 1230 02/12/18 1248 02/12/18 1630 02/12/18 1700  BP: 131/83 136/87 134/83   Pulse: 98 90 95   Resp: 11 14 18    Temp:  99.2 F (37.3 C) 99.5 F (37.5 C) 98.8 F (37.1 C)  TempSrc:  Oral Oral Oral  SpO2: 97% 95% 98%   Weight:      Height:        Patient resting in bed, has been up and ambulating in the halls.  Wound clean and dry; no swelling, erythema, or drainage.  Good relief of radicular pain.  Voiding well.  Plan: Encouraged to ambulate.  Initial instructions for care at home given to the patient and his wife.  Continue to progress through postoperative recovery.  Hewitt ShortsNUDELMAN,ROBERT W, MD 02/12/2018, 6:56 PM

## 2018-02-12 NOTE — H&P (Signed)
Subjective: Patient is a 37 y.o. right handed white male who is admitted for treatment of multilevel cervical disc herniations, superimposed upon underlying cervical spondylosis and degenerative disease, with resulting bilateral cervical radiculopathy, currently left worse than right.  Exam shows left triceps weakness.  He has had difficulties with neck pain for the past couple of years, with more right cervical radiculopathy initially, but recently more intense left cervical radicular pain.  He has not responded to symptomatic treatment, and is admitted now for a 3 level C4-5, C5-6, C6-7 anterior cervical decompression and arthrodesis with structural allograft and cervical plating.   Past Medical History:  Diagnosis Date  . Arthritis    neck  . GERD (gastroesophageal reflux disease)     Past Surgical History:  Procedure Laterality Date  . WISDOM TOOTH EXTRACTION      Medications Prior to Admission  Medication Sig Dispense Refill Last Dose  . acetaminophen (TYLENOL) 650 MG CR tablet Take 1,300 mg by mouth daily as needed for pain.   Past Week at Unknown time  . celecoxib (CELEBREX) 200 MG capsule Take 200 mg by mouth daily.  2 Past Month at Unknown time  . loratadine (CLARITIN) 10 MG tablet Take 10 mg by mouth at bedtime as needed for allergies.    02/11/2018 at Unknown time  . ranitidine (ZANTAC) 150 MG tablet Take 150 mg by mouth as needed for heartburn.   More than a month at Unknown time   No Known Allergies  Social History   Tobacco Use  . Smoking status: Current Every Day Smoker    Packs/day: 0.25    Types: Cigarettes  . Smokeless tobacco: Current User    Types: Snuff  Substance Use Topics  . Alcohol use: Yes    Alcohol/week: 12.6 oz    Types: 21 Cans of beer per week    Family History  Problem Relation Age of Onset  . Breast cancer Mother      Review of Systems Pertinent items noted in HPI and remainder of comprehensive ROS otherwise negative.  Objective: Vital signs  in last 24 hours: Temp:  [98.8 F (37.1 C)] 98.8 F (37.1 C) (04/18 0545) Pulse Rate:  [98] 98 (04/18 0546) Resp:  [20] 20 (04/18 0545) BP: (145)/(90) 145/90 (04/18 0545) SpO2:  [98 %] 98 % (04/18 0546) Weight:  [70.8 kg (156 lb)] 70.8 kg (156 lb) (04/18 0545)  EXAM: Patient well-developed well-nourished white male in no acute distress.   Lungs are clear to auscultation , the patient has symmetrical respiratory excursion. Heart has a regular rate and rhythm normal S1 and S2 no murmur.   Abdomen is soft nontender nondistended bowel sounds are present. Extremity examination shows no clubbing cyanosis or edema. Motor examination shows weakness of the left triceps, 4-/5, with the remainder of the upper extremity strength being 5 over 5  including the deltoid and biceps bilaterally, right triceps, and intrinsics and grip bilaterally. Sensation is intact to pinprick throughout the digits of the upper extremities. Reflexes are symmetrical and without evidence of pathologic reflexes. Patient has a normal gait and stance.  Data Review:CBC    Component Value Date/Time   WBC 7.7 02/06/2018 0935   RBC 4.73 02/06/2018 0935   HGB 15.3 02/06/2018 0935   HCT 44.7 02/06/2018 0935   PLT 208 02/06/2018 0935   MCV 94.5 02/06/2018 0935   MCH 32.3 02/06/2018 0935   MCHC 34.2 02/06/2018 0935   RDW 13.7 02/06/2018 0935  BMET    Component Value Date/Time   NA 137 02/06/2018 0935   K 4.1 02/06/2018 0935   CL 101 02/06/2018 0935   CO2 21 (L) 02/06/2018 0935   GLUCOSE 91 02/06/2018 0935   BUN <5 (L) 02/06/2018 0935   CREATININE 0.73 02/06/2018 0935   CALCIUM 9.0 02/06/2018 0935   GFRNONAA >60 02/06/2018 0935   GFRAA >60 02/06/2018 0935     Assessment/Plan: Patient with 3 level cervical degeneration with disc herniations, spondylosis, and degenerative disc disease, who is admitted now for a 3 level ACDF.  I've discussed with the patient the nature of his condition, the nature  the surgical procedure, the typical length of surgery, hospital stay, and overall recuperation. We discussed limitations postoperatively. I discussed risks of surgery including risks of infection, bleeding, possibly need for transfusion, the risk of nerve root dysfunction with pain, weakness, numbness, or paresthesias, the risk of spinal cord dysfunction with paralysis of all 4 limbs and quadriplegia, and the risk of dural tear and CSF leakage and possible need for further surgery, the risk of esophageal dysfunction causing dysphagia and the risk of laryngeal dysfunction causing hoarseness of the voice, the risk of failure of the arthrodesis and the possible need for further surgery, and the risk of anesthetic complications including myocardial infarction, stroke, pneumonia, and death. We also discussed the need for postoperative immobilization in a cervical collar. Understanding all this the patient does wish to proceed with surgery and is admitted for such.   Hewitt ShortsNUDELMAN,ROBERT W, MD 02/12/2018 6:59 AM

## 2018-02-13 ENCOUNTER — Encounter (HOSPITAL_COMMUNITY): Payer: Self-pay | Admitting: Neurosurgery

## 2018-02-13 MED ORDER — HYDROCODONE-ACETAMINOPHEN 5-325 MG PO TABS: 1.0000 | ORAL_TABLET | ORAL | 0 refills | Status: DC | PRN

## 2018-02-13 MED FILL — Thrombin For Soln 20000 Unit: CUTANEOUS | Qty: 1 | Status: AC

## 2018-02-13 MED FILL — Thrombin For Soln 5000 Unit: CUTANEOUS | Qty: 5000 | Status: AC

## 2018-02-13 NOTE — Progress Notes (Signed)
Patient alert and oriented, mae's well, voiding adequate amount of urine, swallowing without difficulty, no c/o pain at time of discharge. Patient discharged home with family. Script and discharged instructions given to patient. Patient and family stated understanding of instructions given. Patient has an appointment with Dr. Nudelman 

## 2018-02-13 NOTE — Discharge Summary (Signed)
Physician Discharge Summary  Patient ID: Ricardo Williamson MRN: [id number] DOB/AGE: [birth year] 37 y.o.  Admit date: 02/12/2018 Discharge date: 02/13/2018  Admission Diagnoses:  Cervical HNP, cervical spondylosis, cervical degenerative disc disease, cervical radiculopathy  Discharge Diagnoses:   Cervical HNP, cervical spondylosis, cervical degenerative disc disease, cervical radiculopathy Active Problems:   HNP (herniated nucleus pulposus), cervical   Discharged Condition: good  Hospital Course: Patient was admitted, underwent a 3 level C4-5, C5-6, C6-7 ACDF.  Postoperatively has done well.  He is up and ambulating actively in the halls.  He is eating well.  He is voiding well.  His incision is clean and dry.  We are discharging him to home with instructions regarding wound care and activities.  He is scheduled follow-up with me in the office in 3 weeks.  Discharge Exam: Blood pressure (!) 135/93, pulse 62, temperature 98.6 F (37 C), temperature source Oral, resp. rate 17, height 5\' 11"  (1.803 m), weight 70.8 kg (156 lb), SpO2 100 %.  Disposition: Discharge disposition: 01-Home or Self Care       Discharge Instructions    Discharge wound care:   Complete by:  As directed    Leave the wound open to air. Shower daily with the wound uncovered. Water and soapy water should run over the incision area. Do not wash directly on the incision for 2 weeks. Remove the glue after 2 weeks.   Driving Restrictions   Complete by:  As directed    No driving for 2 weeks. May ride in the car locally now. May begin to drive locally in 2 weeks.   Other Restrictions   Complete by:  As directed    Walk gradually increasing distances out in the fresh air at least twice a day. Walking additional 6 times inside the house, gradually increasing distances, daily. No bending, lifting, or twisting. Perform activities between shoulder and waist height (that is at counter height when standing or table height when  sitting).     Allergies as of 02/13/2018   No Known Allergies     Medication List    TAKE these medications   acetaminophen 650 MG CR tablet Commonly known as:  TYLENOL Take 1,300 mg by mouth daily as needed for pain.   celecoxib 200 MG capsule Commonly known as:  CELEBREX Take 200 mg by mouth daily.   HYDROcodone-acetaminophen 5-325 MG tablet Commonly known as:  NORCO/VICODIN Take 1-2 tablets by mouth every 4 (four) hours as needed (pain).   loratadine 10 MG tablet Commonly known as:  CLARITIN Take 10 mg by mouth at bedtime as needed for allergies.   ranitidine 150 MG tablet Commonly known as:  ZANTAC Take 150 mg by mouth as needed for heartburn.            Discharge Care Instructions  (From admission, onward)        Start     Ordered   02/13/18 0000  Discharge wound care:    Comments:  Leave the wound open to air. Shower daily with the wound uncovered. Water and soapy water should run over the incision area. Do not wash directly on the incision for 2 weeks. Remove the glue after 2 weeks.   02/13/18 0817       Signed: Hewitt ShortsNUDELMAN,ROBERT W 02/13/2018, 8:18 AM

## 2018-03-04 DIAGNOSIS — M5020 Other cervical disc displacement, unspecified cervical region: Secondary | ICD-10-CM | POA: Diagnosis not present

## 2018-04-01 DIAGNOSIS — G9341 Metabolic encephalopathy: Secondary | ICD-10-CM | POA: Diagnosis not present

## 2018-04-01 DIAGNOSIS — R7989 Other specified abnormal findings of blood chemistry: Secondary | ICD-10-CM | POA: Diagnosis not present

## 2018-04-01 DIAGNOSIS — F10220 Alcohol dependence with intoxication, uncomplicated: Secondary | ICD-10-CM | POA: Diagnosis not present

## 2018-04-09 DIAGNOSIS — Z0000 Encounter for general adult medical examination without abnormal findings: Secondary | ICD-10-CM | POA: Diagnosis not present

## 2018-05-08 DIAGNOSIS — Z981 Arthrodesis status: Secondary | ICD-10-CM | POA: Diagnosis not present

## 2018-07-08 DIAGNOSIS — M4802 Spinal stenosis, cervical region: Secondary | ICD-10-CM | POA: Diagnosis not present

## 2018-07-08 DIAGNOSIS — Z0000 Encounter for general adult medical examination without abnormal findings: Secondary | ICD-10-CM | POA: Diagnosis not present

## 2018-07-08 DIAGNOSIS — Z6820 Body mass index (BMI) 20.0-20.9, adult: Secondary | ICD-10-CM | POA: Diagnosis not present

## 2018-07-08 DIAGNOSIS — Z87891 Personal history of nicotine dependence: Secondary | ICD-10-CM | POA: Diagnosis not present

## 2018-07-08 DIAGNOSIS — Z1331 Encounter for screening for depression: Secondary | ICD-10-CM | POA: Diagnosis not present

## 2018-07-08 DIAGNOSIS — Z720 Tobacco use: Secondary | ICD-10-CM | POA: Diagnosis not present

## 2018-07-08 DIAGNOSIS — M5030 Other cervical disc degeneration, unspecified cervical region: Secondary | ICD-10-CM | POA: Diagnosis not present

## 2019-03-30 IMAGING — CR DG CERVICAL SPINE 2 OR 3 VIEWS
1 series · 1 of 1 positions shown · non-contrast
Comparison: 01/21/2018

CLINICAL DATA: Intraoperative localization

EXAM:
CERVICAL SPINE - 2 VIEW

[AP]
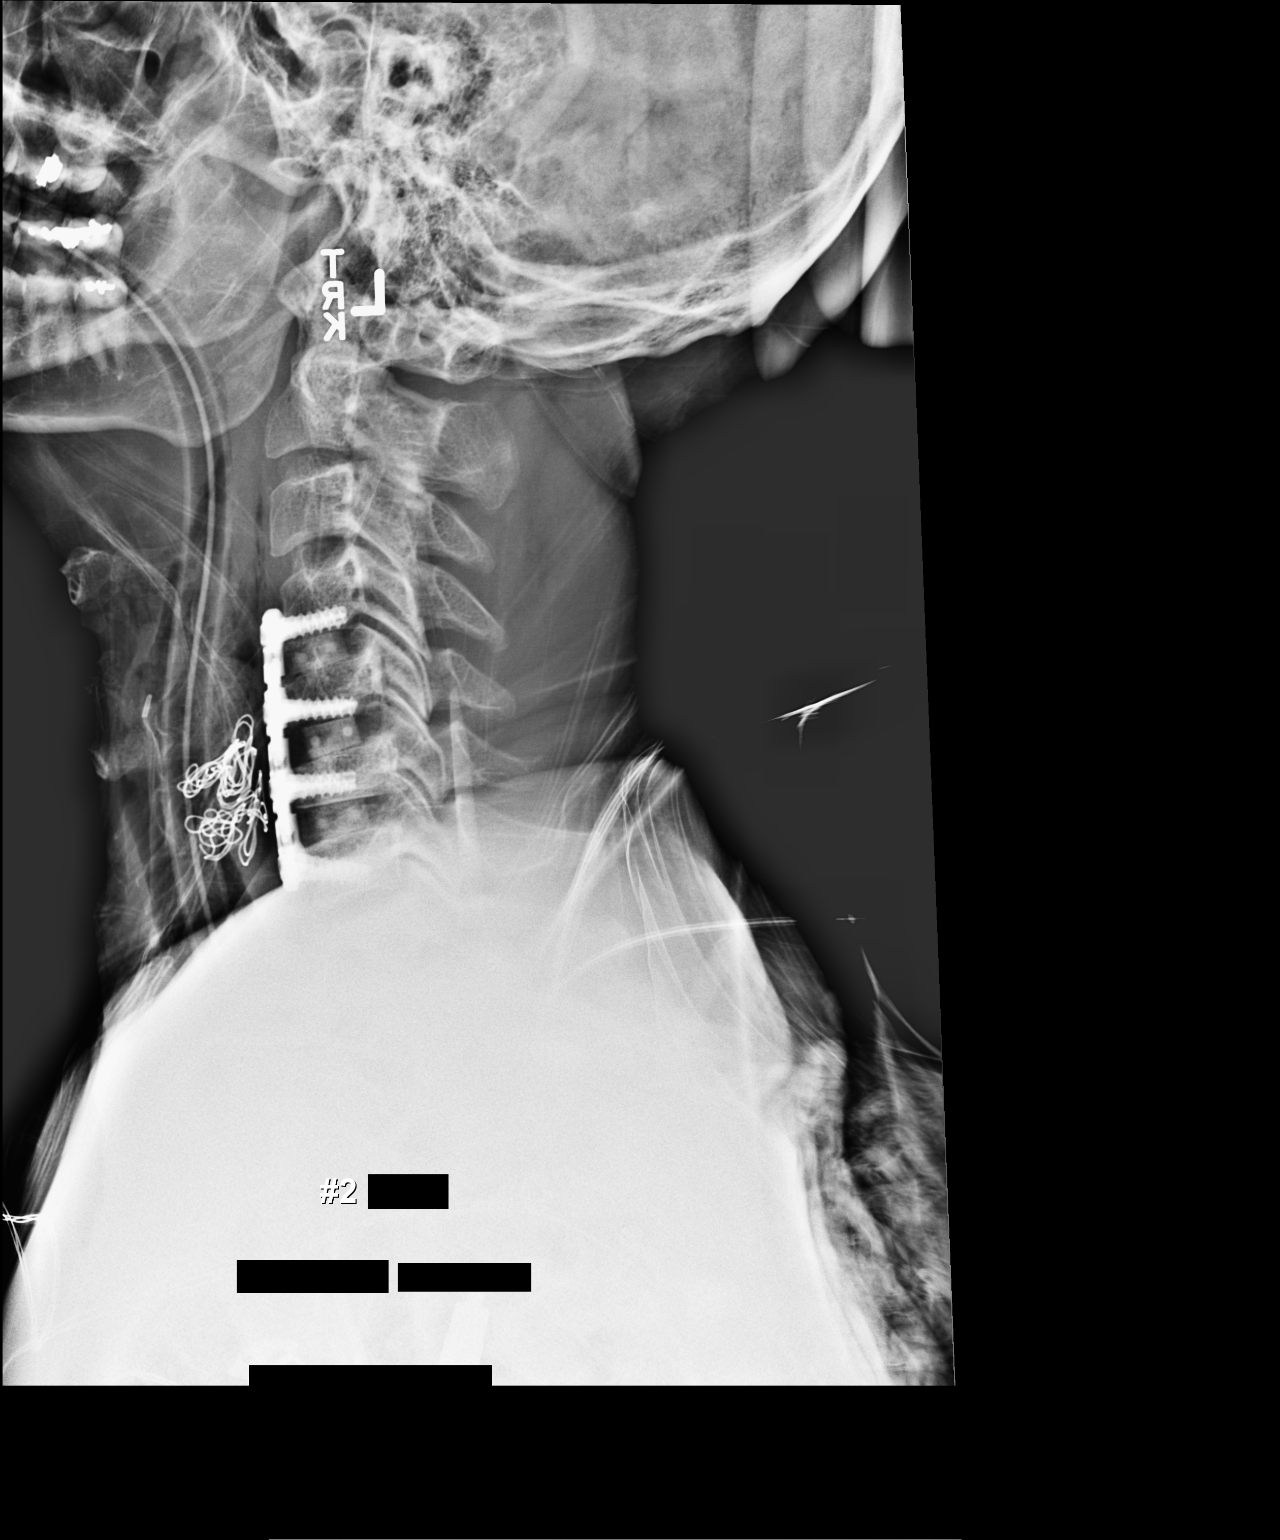

[1 of 1 positions shown; findings below may reference images not displayed]

FINDINGS: Two lateral images of the cervical spine were obtained
intraoperatively. The initial image obtained at 829 hours
demonstrates needles within the anterior aspect of the C5-6 and C6-7
disc spaces. Subsequent image obtained at [DATE] shows evidence of
interbody fusion at C4-5, C5-6 and C6-7 with anterior fixation.
IMPRESSION: Intraoperative localization and subsequent cervical fusion.

## 2019-11-08 DIAGNOSIS — F1020 Alcohol dependence, uncomplicated: Secondary | ICD-10-CM | POA: Insufficient documentation

## 2019-11-08 DIAGNOSIS — L239 Allergic contact dermatitis, unspecified cause: Secondary | ICD-10-CM | POA: Insufficient documentation

## 2020-12-26 DIAGNOSIS — F1020 Alcohol dependence, uncomplicated: Secondary | ICD-10-CM | POA: Diagnosis not present

## 2021-01-12 DIAGNOSIS — F1020 Alcohol dependence, uncomplicated: Secondary | ICD-10-CM | POA: Diagnosis not present

## 2021-12-19 DIAGNOSIS — S62634A Displaced fracture of distal phalanx of right ring finger, initial encounter for closed fracture: Secondary | ICD-10-CM | POA: Diagnosis not present

## 2021-12-19 DIAGNOSIS — S67191A Crushing injury of left index finger, initial encounter: Secondary | ICD-10-CM | POA: Diagnosis not present

## 2021-12-19 DIAGNOSIS — S61211A Laceration without foreign body of left index finger without damage to nail, initial encounter: Secondary | ICD-10-CM | POA: Diagnosis not present

## 2022-05-05 DIAGNOSIS — S51011A Laceration without foreign body of right elbow, initial encounter: Secondary | ICD-10-CM | POA: Diagnosis not present

## 2022-11-06 ENCOUNTER — Ambulatory Visit: Payer: BC Managed Care – PPO | Admitting: Internal Medicine

## 2022-11-06 ENCOUNTER — Encounter: Payer: Self-pay | Admitting: Internal Medicine

## 2022-11-06 VITALS — BP 126/84 | HR 96 | Temp 98.0°F | Resp 18 | Ht 71.0 in | Wt 163.2 lb

## 2022-11-06 DIAGNOSIS — R683 Clubbing of fingers: Secondary | ICD-10-CM

## 2022-11-06 DIAGNOSIS — F1011 Alcohol abuse, in remission: Secondary | ICD-10-CM | POA: Insufficient documentation

## 2022-11-06 NOTE — Assessment & Plan Note (Signed)
He has clubbing of his nails on his feet and hands.  This could be primary hypertrophic osteoarthopathy.  We will do some lab testing at this time which will include a CT scan of his chest to evaluate for malignancy or abscess/infection.  I will check a TB skin test as well.  We will obtain an ECHO to make sure he does not have a cardiac etiology.  He does not have cyanotic heart disease as this just started in the last year.  We will do a number of blood tests on him as well.  I am concerned this could be related to his history of alcohol use.  Further care will depend on his testing.

## 2022-11-06 NOTE — Progress Notes (Unsigned)
Office Visit  Subjective   Patient ID: Ricardo Williamson   DOB: 1980-12-03   Age: 42 y.o.   MRN: [id number]   Chief Complaint Chief Complaint  Patient presents with   Finger discoloration    Lasting about a year     History of Present Illness [name] comes in today with complaints of clubbing of all the fingers of his hands and fingernail fungus.  I have not seen the patient in 2 years but he states he has noted clubbing for about 1 year.  He has rounded/expanded nails of his fingers and his toes with most of his fingers and his toes with thickening/fungus.  Today, he denies any chest pain, palpitations, DOE, SOB at rest, fatigue, chronic cough, hemoptysis, abdominal pain, or peripheral edema.  He does smoke currently 1.5 ppd where he started smoking at age 71 so he has about 35-37 pack year history.  The patient has a history of alcohol abuse where he admits for the last year he drinks only on the weekend and will have 1-2 beers.  He states he has a cough first thing in the morning for over a year.       Past Medical History Past Medical History:  Diagnosis Date   Arthritis    neck   GERD (gastroesophageal reflux disease)      Allergies No Known Allergies   Review of Systems Review of Systems  Constitutional:  Negative for chills, fever, malaise/fatigue and weight loss.  Eyes:  Negative for blurred vision and double vision.  Respiratory:  Negative for cough, shortness of breath and wheezing.   Cardiovascular:  Negative for chest pain, palpitations and leg swelling.  Gastrointestinal:  Negative for abdominal pain, constipation, diarrhea, nausea and vomiting.  Musculoskeletal:  Negative for myalgias.  Skin:  Negative for itching and rash.  Neurological:  Negative for dizziness, weakness and headaches.       Objective:    Vitals BP 126/84 (BP Location: Right Arm, Patient Position: Sitting, Cuff Size: Normal)   Pulse 96   Temp 98 F (36.7 C) (Temporal)   Resp 18    Ht 5\' 11"  (1.803 m)   Wt 163 lb 3.2 oz (74 kg)   SpO2 98%   BMI 22.76 kg/m    Physical Examination Physical Exam Constitutional:      Appearance: Normal appearance. He is not ill-appearing.  Cardiovascular:     Rate and Rhythm: Normal rate and regular rhythm.     Pulses: Normal pulses.     Heart sounds: No murmur heard.    No friction rub. No gallop.  Pulmonary:     Effort: Pulmonary effort is normal. No respiratory distress.     Breath sounds: Normal breath sounds. No wheezing, rhonchi or rales.  Abdominal:     General: Abdomen is flat. Bowel sounds are normal. There is no distension.     Palpations: Abdomen is soft.     Tenderness: There is no abdominal tenderness.  Musculoskeletal:     Right lower leg: No edema.     Left lower leg: No edema.     Comments: He has clubbing of all his fingers and toes.  There is no cyanosis.  Skin:    General: Skin is warm and dry.     Findings: No rash.     Comments: He has finger and toenail fungus in mulitiple nails of fingers and toes.  Neurological:     General: No focal deficit present.  Mental Status: He is alert and oriented to person, place, and time.        Assessment & Plan:   Clubbing of nails He has clubbing of his nails on his feet and hands.  This could be primary hypertrophic osteoarthopathy.  We will do some lab testing at this time which will include a CT scan of his chest to evaluate for malignancy or abscess/infection.  I will check a TB skin test as well.  We will obtain an ECHO to make sure he does not have a cardiac etiology.  He does not have cyanotic heart disease as this just started in the last year.  We will do a number of blood tests on him as well.  I am concerned this could be related to his history of alcohol use.  Further care will depend on his testing.  History of alcohol abuse Workup as above.    Return in about 2 months (around 01/05/2023) for annual.   Townsend Roger, MD

## 2022-11-06 NOTE — Assessment & Plan Note (Signed)
Work up as above 

## 2022-11-07 LAB — CMP14 + ANION GAP
ALT: 48 IU/L — ABNORMAL HIGH (ref 0–44)
AST: 72 IU/L — ABNORMAL HIGH (ref 0–40)
Albumin/Globulin Ratio: 2 (ref 1.2–2.2)
Albumin: 4.9 g/dL (ref 4.1–5.1)
Alkaline Phosphatase: 92 IU/L (ref 44–121)
Anion Gap: 16 mmol/L (ref 10.0–18.0)
BUN/Creatinine Ratio: 5 — ABNORMAL LOW (ref 9–20)
BUN: 4 mg/dL — ABNORMAL LOW (ref 6–24)
Bilirubin Total: 0.4 mg/dL (ref 0.0–1.2)
CO2: 23 mmol/L (ref 20–29)
Calcium: 9.2 mg/dL (ref 8.7–10.2)
Chloride: 97 mmol/L (ref 96–106)
Creatinine, Ser: 0.76 mg/dL (ref 0.76–1.27)
Globulin, Total: 2.4 g/dL (ref 1.5–4.5)
Glucose: 81 mg/dL (ref 70–99)
Potassium: 4.4 mmol/L (ref 3.5–5.2)
Sodium: 136 mmol/L (ref 134–144)
Total Protein: 7.3 g/dL (ref 6.0–8.5)
eGFR: 117 mL/min/{1.73_m2} (ref >59)

## 2022-11-07 LAB — CBC WITH DIFFERENTIAL/PLATELET
Basophils Absolute: 0.1 10*3/uL (ref 0.0–0.2)
Basos: 1 %
EOS (ABSOLUTE): 0 10*3/uL (ref 0.0–0.4)
Eos: 1 %
Hematocrit: 41.9 % (ref 37.5–51.0)
Hemoglobin: 15.1 g/dL (ref 13.0–17.7)
Immature Grans (Abs): 0 10*3/uL (ref 0.0–0.1)
Immature Granulocytes: 0 %
Lymphocytes Absolute: 1.6 10*3/uL (ref 0.7–3.1)
Lymphs: 32 %
MCH: 33.1 pg — ABNORMAL HIGH (ref 26.6–33.0)
MCHC: 36 g/dL — ABNORMAL HIGH (ref 31.5–35.7)
MCV: 92 fL (ref 79–97)
Monocytes Absolute: 0.8 10*3/uL (ref 0.1–0.9)
Monocytes: 16 %
Neutrophils Absolute: 2.5 10*3/uL (ref 1.4–7.0)
Neutrophils: 50 %
Platelets: 188 10*3/uL (ref 150–450)
RBC: 4.56 x10E6/uL (ref 4.14–5.80)
RDW: 11.1 % — ABNORMAL LOW (ref 11.6–15.4)
WBC: 5 10*3/uL (ref 3.4–10.8)

## 2022-11-07 LAB — TSH: TSH: 2.08 u[IU]/mL (ref 0.450–4.500)

## 2022-11-07 LAB — SEDIMENTATION RATE: Sed Rate: 6 mm/hr (ref 0–15)

## 2022-11-08 ENCOUNTER — Ambulatory Visit: Payer: BC Managed Care – PPO

## 2022-11-08 DIAGNOSIS — N62 Hypertrophy of breast: Secondary | ICD-10-CM | POA: Diagnosis not present

## 2022-11-08 DIAGNOSIS — J929 Pleural plaque without asbestos: Secondary | ICD-10-CM | POA: Diagnosis not present

## 2022-11-08 DIAGNOSIS — R932 Abnormal findings on diagnostic imaging of liver and biliary tract: Secondary | ICD-10-CM | POA: Diagnosis not present

## 2022-11-08 DIAGNOSIS — R918 Other nonspecific abnormal finding of lung field: Secondary | ICD-10-CM | POA: Diagnosis not present

## 2022-11-08 DIAGNOSIS — R683 Clubbing of fingers: Secondary | ICD-10-CM | POA: Diagnosis not present

## 2022-11-14 ENCOUNTER — Ambulatory Visit: Payer: Self-pay

## 2022-11-22 ENCOUNTER — Ambulatory Visit: Payer: BC Managed Care – PPO | Attending: Cardiology

## 2022-11-22 DIAGNOSIS — I340 Nonrheumatic mitral (valve) insufficiency: Secondary | ICD-10-CM

## 2022-11-22 DIAGNOSIS — R683 Clubbing of fingers: Secondary | ICD-10-CM | POA: Diagnosis not present

## 2022-11-22 DIAGNOSIS — I517 Cardiomegaly: Secondary | ICD-10-CM | POA: Diagnosis not present

## 2022-11-22 DIAGNOSIS — F1011 Alcohol abuse, in remission: Secondary | ICD-10-CM | POA: Diagnosis not present

## 2022-11-23 LAB — ECHOCARDIOGRAM COMPLETE
Area-P 1/2: 3.2 cm2
S' Lateral: 3.2 cm

## 2022-11-27 ENCOUNTER — Ambulatory Visit: Payer: BC Managed Care – PPO | Admitting: Internal Medicine

## 2022-11-27 ENCOUNTER — Encounter: Payer: Self-pay | Admitting: Internal Medicine

## 2022-11-27 VITALS — BP 124/80 | HR 84 | Temp 98.0°F | Resp 18 | Ht 72.0 in | Wt 165.8 lb

## 2022-11-27 DIAGNOSIS — R683 Clubbing of fingers: Secondary | ICD-10-CM

## 2022-11-27 DIAGNOSIS — J0100 Acute maxillary sinusitis, unspecified: Secondary | ICD-10-CM | POA: Diagnosis not present

## 2022-11-27 DIAGNOSIS — R509 Fever, unspecified: Secondary | ICD-10-CM

## 2022-11-27 LAB — POC COVID19 BINAXNOW: SARS Coronavirus 2 Ag: NEGATIVE

## 2022-11-27 LAB — POCT INFLUENZA A/B
Influenza A, POC: NEGATIVE
Influenza B, POC: NEGATIVE

## 2022-11-27 MED ORDER — AMOXICILLIN-POT CLAVULANATE 875-125 MG PO TABS: 1.0000 | ORAL_TABLET | Freq: Two times a day (BID) | ORAL | 0 refills | Status: DC

## 2022-11-27 MED ORDER — FLUTICASONE PROPIONATE 50 MCG/ACT NA SUSP: 1.0000 | Freq: Two times a day (BID) | NASAL | 0 refills | Status: DC

## 2022-11-27 NOTE — Progress Notes (Signed)
Office Visit  Subjective   Patient ID: Ricardo Williamson   DOB: Mar 07, 1981   Age: 42 y.o.   MRN: [id number]   Chief Complaint Chief Complaint  Patient presents with   Headache    Drainage and congestion for less than a week  2 negative COVID tests Fever     History of Present Illness The patient is a 42 year old male who presents with upper respiratory tract symptoms which began 2-3 days ago.  This started in his sinus with sinus congestion with white nasal discharge with post nasal discharge.  He is having low grade fevers with sinus pressure/headache.  He denies any chest congestion, cough, sore throat, body aches, shortness of breath, weakness, nausea, vomiting, diarrhea, wheezing, and chills. Alleviating factors: none.  He has done home COVID-19 testing and this was negative.  The patient does smoke.       Past Medical History Past Medical History:  Diagnosis Date   Arthritis    neck   GERD (gastroesophageal reflux disease)      Allergies No Known Allergies   Medications  Current Outpatient Medications:    loratadine (CLARITIN) 10 MG tablet, Take 10 mg by mouth at bedtime as needed for allergies. , Disp: , Rfl:    tadalafil (CIALIS) 20 MG tablet, Take by mouth daily as needed., Disp: , Rfl:    Review of Systems Review of Systems  Constitutional:  Positive for fever. Negative for chills.  Respiratory:  Negative for cough, sputum production, shortness of breath and wheezing.   Cardiovascular:  Negative for chest pain, palpitations and leg swelling.  Gastrointestinal:  Negative for abdominal pain, constipation, diarrhea, nausea and vomiting.  Musculoskeletal:  Negative for myalgias.  Neurological:  Negative for weakness and headaches.       Objective:    Vitals BP 124/80 (BP Location: Right Arm, Patient Position: Sitting, Cuff Size: Normal)   Pulse 84   Temp 98 F (36.7 C) (Temporal)   Resp 18   Ht 6' (1.829 m)   Wt 165 lb 12.8 oz (75.2 kg)   SpO2 98%    BMI 22.49 kg/m    Physical Examination Physical Exam Constitutional:      Appearance: Normal appearance. He is not ill-appearing.  HENT:     Right Ear: Tympanic membrane, ear canal and external ear normal.     Left Ear: Tympanic membrane, ear canal and external ear normal.     Nose: Nose normal. No congestion or rhinorrhea.     Mouth/Throat:     Mouth: Mucous membranes are moist.     Pharynx: Oropharynx is clear. No oropharyngeal exudate or posterior oropharyngeal erythema.  Cardiovascular:     Rate and Rhythm: Normal rate and regular rhythm.     Pulses: Normal pulses.     Heart sounds: No murmur heard.    No friction rub. No gallop.  Pulmonary:     Effort: Pulmonary effort is normal. No respiratory distress.     Breath sounds: No wheezing, rhonchi or rales.  Abdominal:     General: Abdomen is flat. Bowel sounds are normal. There is no distension.     Palpations: Abdomen is soft.     Tenderness: There is no abdominal tenderness.  Musculoskeletal:     Right lower leg: No edema.     Left lower leg: No edema.  Skin:    General: Skin is warm and dry.     Findings: No rash.  Neurological:  Mental Status: He is alert.        Assessment & Plan:   Acute non-recurrent maxillary sinusitis We tested him for COVID-19 and influenza which were negative.  He has a probable sinus infection where we will start augmentin and flonase.  Clubbing of nails I went over his labs and his ECHO.  I spoke to cardiology and they recommend a CTA of his chest to make sure he does not have an anomaly of a pulmonary artery.  He will need to be sent to pulmonary medicine for workup of his clubbing as well.    No follow-ups on file.   Townsend Roger, MD

## 2022-11-27 NOTE — Assessment & Plan Note (Signed)
I went over his labs and his ECHO.  I spoke to cardiology and they recommend a CTA of his chest to make sure he does not have an anomaly of a pulmonary artery.  He will need to be sent to pulmonary medicine for workup of his clubbing as well.

## 2022-11-27 NOTE — Assessment & Plan Note (Signed)
We tested him for COVID-19 and influenza which were negative.  He has a probable sinus infection where we will start augmentin and flonase.

## 2022-11-28 DIAGNOSIS — R1310 Dysphagia, unspecified: Secondary | ICD-10-CM | POA: Diagnosis not present

## 2022-11-28 DIAGNOSIS — K649 Unspecified hemorrhoids: Secondary | ICD-10-CM | POA: Diagnosis not present

## 2022-11-28 DIAGNOSIS — K219 Gastro-esophageal reflux disease without esophagitis: Secondary | ICD-10-CM | POA: Diagnosis not present

## 2022-11-28 DIAGNOSIS — K625 Hemorrhage of anus and rectum: Secondary | ICD-10-CM | POA: Diagnosis not present

## 2022-12-06 DIAGNOSIS — T18108A Unspecified foreign body in esophagus causing other injury, initial encounter: Secondary | ICD-10-CM | POA: Diagnosis not present

## 2022-12-06 DIAGNOSIS — R1310 Dysphagia, unspecified: Secondary | ICD-10-CM | POA: Diagnosis not present

## 2022-12-09 DIAGNOSIS — R918 Other nonspecific abnormal finding of lung field: Secondary | ICD-10-CM | POA: Diagnosis not present

## 2022-12-09 DIAGNOSIS — R683 Clubbing of fingers: Secondary | ICD-10-CM | POA: Diagnosis not present

## 2022-12-25 DIAGNOSIS — K2101 Gastro-esophageal reflux disease with esophagitis, with bleeding: Secondary | ICD-10-CM | POA: Diagnosis not present

## 2022-12-25 DIAGNOSIS — K208 Other esophagitis without bleeding: Secondary | ICD-10-CM | POA: Diagnosis not present

## 2022-12-25 DIAGNOSIS — K449 Diaphragmatic hernia without obstruction or gangrene: Secondary | ICD-10-CM | POA: Diagnosis not present

## 2022-12-25 DIAGNOSIS — K209 Esophagitis, unspecified without bleeding: Secondary | ICD-10-CM | POA: Diagnosis not present

## 2022-12-25 DIAGNOSIS — K222 Esophageal obstruction: Secondary | ICD-10-CM | POA: Diagnosis not present

## 2022-12-25 DIAGNOSIS — K2270 Barrett's esophagus without dysplasia: Secondary | ICD-10-CM | POA: Diagnosis not present

## 2022-12-25 DIAGNOSIS — R1310 Dysphagia, unspecified: Secondary | ICD-10-CM | POA: Diagnosis not present

## 2023-01-17 ENCOUNTER — Other Ambulatory Visit: Payer: Self-pay

## 2023-01-17 MED ORDER — TADALAFIL 20 MG PO TABS: 20.0000 mg | ORAL_TABLET | Freq: Every day | ORAL | 5 refills | Status: DC | PRN

## 2023-05-21 ENCOUNTER — Encounter: Payer: Self-pay | Admitting: Internal Medicine

## 2023-05-21 DIAGNOSIS — K649 Unspecified hemorrhoids: Secondary | ICD-10-CM | POA: Diagnosis not present

## 2023-05-22 DIAGNOSIS — K2270 Barrett's esophagus without dysplasia: Secondary | ICD-10-CM | POA: Diagnosis not present

## 2023-05-22 DIAGNOSIS — R1310 Dysphagia, unspecified: Secondary | ICD-10-CM | POA: Diagnosis not present

## 2023-05-22 DIAGNOSIS — K6289 Other specified diseases of anus and rectum: Secondary | ICD-10-CM | POA: Diagnosis not present

## 2023-05-22 DIAGNOSIS — K649 Unspecified hemorrhoids: Secondary | ICD-10-CM | POA: Diagnosis not present

## 2023-06-20 ENCOUNTER — Encounter: Payer: Self-pay | Admitting: Internal Medicine

## 2023-07-28 ENCOUNTER — Encounter: Payer: Self-pay | Admitting: Student

## 2023-07-28 ENCOUNTER — Ambulatory Visit: Payer: BC Managed Care – PPO | Admitting: Student

## 2023-07-28 VITALS — BP 140/82 | HR 88 | Temp 98.3°F | Resp 18 | Ht 72.0 in | Wt 155.5 lb

## 2023-07-28 DIAGNOSIS — N529 Male erectile dysfunction, unspecified: Secondary | ICD-10-CM

## 2023-07-28 DIAGNOSIS — J0100 Acute maxillary sinusitis, unspecified: Secondary | ICD-10-CM | POA: Diagnosis not present

## 2023-07-28 HISTORY — DX: Male erectile dysfunction, unspecified: N52.9

## 2023-07-28 MED ORDER — FLUTICASONE PROPIONATE 50 MCG/ACT NA SUSP: 1.0000 | Freq: Two times a day (BID) | NASAL | 0 refills | Status: DC

## 2023-07-28 MED ORDER — TADALAFIL 20 MG PO TABS: 20.0000 mg | ORAL_TABLET | Freq: Every day | ORAL | 0 refills | Status: DC | PRN

## 2023-07-28 MED ORDER — AMOXICILLIN-POT CLAVULANATE 875-125 MG PO TABS: 1.0000 | ORAL_TABLET | Freq: Two times a day (BID) | ORAL | 0 refills | Status: DC

## 2023-07-28 NOTE — Assessment & Plan Note (Signed)
He request refill of cialis #30 instead of #10.

## 2023-07-28 NOTE — Progress Notes (Signed)
Established Patient Office Visit  Subjective   Patient ID: Ricardo Williamson, male    DOB: 28-Apr-1981  Age: 42 y.o. MRN: [id number]  Chief Complaint  Patient presents with   Sinus Problem    X 5 days, sinus pressure    Sinus Problem Associated symptoms include congestion, coughing and headaches. Pertinent negatives include no chills, diaphoresis, shortness of breath or sore throat.    Ricardo Williamson is a 41year old male who presents with 5 days of sinus pressure. He reports   Sinusitis Patient presents with chronic sinusitis. The patient reports chronic sinus infections for 2 year.  His symptoms include nasal congestion, clear rhinorrhea, cough, headaches, facial pain.  There has not been a history of nasal congestion, clear rhinorrhea, cough, headaches. There has not been a history of chronic otitis media or pharyngotonsillitis.  Prior antibiotic therapy has included Augmentin and flonase. Other medications have included Nasacort.     Review of Systems  Constitutional:  Negative for chills, diaphoresis and fever.  HENT:  Positive for congestion. Negative for sinus pain and sore throat.   Eyes: Negative.   Respiratory:  Positive for cough and sputum production. Negative for shortness of breath and wheezing.   Cardiovascular: Negative.   Gastrointestinal: Negative.   Skin: Negative.   Neurological:  Positive for headaches.  Endo/Heme/Allergies:  Positive for environmental allergies.      Objective:     BP (!) 140/82   Pulse 88   Temp 98.3 F (36.8 C)   Resp 18   Ht 6' (1.829 m)   Wt 155 lb 8 oz (70.5 kg)   SpO2 97%   BMI 21.09 kg/m    Physical Exam Vitals reviewed.  Constitutional:      Appearance: Normal appearance.  HENT:     Head: Normocephalic.     Nose: Congestion present.     Mouth/Throat:     Mouth: Mucous membranes are moist.     Pharynx: Oropharynx is clear.  Eyes:     Extraocular Movements: Extraocular movements intact.      Conjunctiva/sclera: Conjunctivae normal.     Pupils: Pupils are equal, round, and reactive to light.  Cardiovascular:     Rate and Rhythm: Normal rate and regular rhythm.     Pulses: Normal pulses.     Heart sounds: Normal heart sounds.  Pulmonary:     Effort: Pulmonary effort is normal.     Breath sounds: Normal breath sounds. No wheezing.  Abdominal:     General: Bowel sounds are normal.     Palpations: Abdomen is soft.  Musculoskeletal:        General: Normal range of motion.     Cervical back: Normal range of motion and neck supple.  Skin:    General: Skin is warm and dry.     Capillary Refill: Capillary refill takes less than 2 seconds.  Neurological:     Mental Status: He is alert and oriented to person, place, and time.  Psychiatric:        Mood and Affect: Mood normal.        Behavior: Behavior normal.       Assessment & Plan:   Problem List Items Addressed This Visit     Acute non-recurrent maxillary sinusitis - Primary    I will treat with Flonase and a course of Augmentin. He should drink plenty of fluids.       Relevant Medications   fluticasone (FLONASE) 50 MCG/ACT nasal spray  amoxicillin-clavulanate (AUGMENTIN) 875-125 MG tablet   Erectile disorder    He request refill of cialis #30 instead of #10.         No follow-ups on file.    Edwena Blow, NP

## 2023-07-28 NOTE — Assessment & Plan Note (Signed)
I will treat with Flonase and a course of Augmentin. He should drink plenty of fluids.

## 2023-08-12 ENCOUNTER — Telehealth: Payer: Self-pay

## 2023-08-13 ENCOUNTER — Ambulatory Visit: Payer: BC Managed Care – PPO | Admitting: Student

## 2023-08-13 ENCOUNTER — Encounter: Payer: Self-pay | Admitting: Student

## 2023-08-13 VITALS — BP 130/82 | HR 98 | Temp 98.4°F | Resp 20 | Ht 72.0 in | Wt 155.5 lb

## 2023-08-13 DIAGNOSIS — J029 Acute pharyngitis, unspecified: Secondary | ICD-10-CM

## 2023-08-13 LAB — POCT RAPID STREP A (OFFICE): Rapid Strep A Screen: POSITIVE — AB

## 2023-08-13 MED ORDER — TADALAFIL 20 MG PO TABS: 20.0000 mg | ORAL_TABLET | Freq: Every day | ORAL | 0 refills | Status: DC | PRN

## 2023-08-13 MED ORDER — PENICILLIN V POTASSIUM 500 MG PO TABS: 500.0000 mg | ORAL_TABLET | Freq: Three times a day (TID) | ORAL | 0 refills | Status: AC

## 2023-08-13 NOTE — Assessment & Plan Note (Signed)
Strep A is positive. He will use penicillin 500 mg TID x 10 days. Instructions provided for supportive care.

## 2023-08-13 NOTE — Progress Notes (Signed)
Acute Office Visit  Subjective:     Patient ID: Ricardo Williamson, male    DOB: October 23, 1981, 42 y.o.   MRN: [id number]  Chief Complaint  Patient presents with   Otalgia    Bilateral ear pain, ongoing since prior to last visit. Finished meds prescribed at last visit.     HPI  Subjective:     Ricardo Williamson is a 42 y.o. male who presents for evaluation of sore throat. Associated symptoms include dry cough, bilateral ear fullness and tinnitus, post nasal drip, sinus and nasal congestion, and sore throat. Onset of symptoms was 16 days ago, and have been gradually worsening since that time. He is drinking plenty of fluids. He is unsure if he had a recent close exposure to someone with proven streptococcal pharyngitis. I saw him last earlier this month where I treated him for non-recurrent sinusitis.    Review of Systems  Constitutional: Negative.   HENT:  Positive for congestion, ear pain and sore throat.   Eyes: Negative.   Respiratory:  Positive for cough.   Cardiovascular: Negative.   Gastrointestinal: Negative.   Genitourinary: Negative.   Musculoskeletal: Negative.   Skin: Negative.   Neurological: Negative.         Objective:    BP 130/82 (BP Location: Left Arm, Patient Position: Sitting, Cuff Size: Normal)   Pulse 98   Temp 98.4 F (36.9 C) (Temporal)   Resp 20   Ht 6' (1.829 m)   Wt 155 lb 8 oz (70.5 kg)   SpO2 99%   BMI 21.09 kg/m    Physical Exam Vitals reviewed.  Constitutional:      Appearance: Normal appearance.  HENT:     Head: Normocephalic.     Right Ear: Tympanic membrane, ear canal and external ear normal. There is no impacted cerumen.     Left Ear: Tympanic membrane, ear canal and external ear normal. There is no impacted cerumen.     Nose: Nose normal.     Mouth/Throat:     Pharynx: Posterior oropharyngeal erythema present. No oropharyngeal exudate.     Tonsils: No tonsillar exudate or tonsillar abscesses. 2+ on the right. 2+ on the left.   Eyes:     Conjunctiva/sclera: Conjunctivae normal.     Pupils: Pupils are equal, round, and reactive to light.  Cardiovascular:     Rate and Rhythm: Normal rate and regular rhythm.     Pulses: Normal pulses.     Heart sounds: Normal heart sounds.  Pulmonary:     Effort: Pulmonary effort is normal.     Breath sounds: Normal breath sounds. No wheezing.  Musculoskeletal:     Cervical back: Normal range of motion and neck supple.  Skin:    General: Skin is warm and dry.     Capillary Refill: Capillary refill takes less than 2 seconds.  Neurological:     Mental Status: He is alert and oriented to person, place, and time.     Results for orders placed or performed in visit on 08/13/23  POCT rapid strep A  Result Value Ref Range   Rapid Strep A Screen Positive (A) Negative        Assessment & Plan:   Problem List Items Addressed This Visit     Sore throat - Primary    Strep A is positive. He will use penicillin 500 mg TID x 10 days. Instructions provided for supportive care.       Relevant Medications  penicillin v potassium (VEETID) 500 MG tablet   Other Relevant Orders   POCT rapid strep A (Completed)    Meds ordered this encounter  Medications   penicillin v potassium (VEETID) 500 MG tablet    Sig: Take 1 tablet (500 mg total) by mouth 3 (three) times daily for 10 days.    Dispense:  30 tablet    Refill:  0   tadalafil (CIALIS) 20 MG tablet    Sig: Take 1 tablet (20 mg total) by mouth daily as needed.    Dispense:  30 tablet    Refill:  0    No follow-ups on file.  Ricardo Blow, NP

## 2023-08-13 NOTE — Telephone Encounter (Signed)
Per provider patient needs to come back in for re evaluation of his ear problem. Appt. Scheduled for 08/13/23

## 2023-09-03 ENCOUNTER — Other Ambulatory Visit: Payer: Self-pay | Admitting: Student

## 2023-09-11 ENCOUNTER — Ambulatory Visit: Payer: BC Managed Care – PPO | Admitting: Internal Medicine

## 2023-09-11 ENCOUNTER — Encounter: Payer: Self-pay | Admitting: Internal Medicine

## 2023-09-11 VITALS — BP 128/78 | HR 94 | Temp 98.8°F | Resp 18 | Ht 72.0 in | Wt 159.2 lb

## 2023-09-11 DIAGNOSIS — J069 Acute upper respiratory infection, unspecified: Secondary | ICD-10-CM | POA: Diagnosis not present

## 2023-09-11 DIAGNOSIS — H6993 Unspecified Eustachian tube disorder, bilateral: Secondary | ICD-10-CM | POA: Diagnosis not present

## 2023-09-11 MED ORDER — FLUTICASONE PROPIONATE 50 MCG/ACT NA SUSP: 1.0000 | Freq: Two times a day (BID) | NASAL | 0 refills | Status: AC

## 2023-09-11 NOTE — Assessment & Plan Note (Signed)
He has eusthachian tube dysfunction.  I do not think he has a bacterial sinus infection.  We will start him on flonase and zyrtec.  Continue supportive care.

## 2023-09-11 NOTE — Assessment & Plan Note (Signed)
As above.  His was negative for COVID-19 and rapid strep.

## 2023-09-11 NOTE — Progress Notes (Signed)
Office Visit  Subjective   Patient ID: Ricardo Williamson   DOB: 12-17-1980   Age: 42 y.o.   MRN: [id number]   Chief Complaint Chief Complaint  Patient presents with   Acute Visit    Cold symptoms x 1 month     History of Present Illness Ricardo Williamson is a 42 y.o. male who returns for an acute visit for an upper respiratory illness.  He comes in today with "an itchy throat" as well as pain in both ears with increased volume.  He is having sinus congestion and pressure with clear nasal discharge with post nasal drip.  There is no chest congestion but no productive cough.  There is no fevers, chills, myalgias, nausea, vomiting or diarrhea.  He saw my NP a month ago with some of the same symptoms.  He was placed on PenVK at that time which helped some but it upsetted his stomach.  He is taking an OTC sinus medication.       Past Medical History Past Medical History:  Diagnosis Date   Arthritis    neck   Erectile disorder 07/28/2023   GERD (gastroesophageal reflux disease)      Allergies No Known Allergies   Medications  Current Outpatient Medications:    fluticasone (FLONASE) 50 MCG/ACT nasal spray, Place 1 spray into both nostrils in the morning and at bedtime., Disp: 15.8 mL, Rfl: 0   fluticasone (FLONASE) 50 MCG/ACT nasal spray, SHAKE LIQUID AND USE 1 SPRAY IN EACH NOSTRIL IN THE MORNING AND AT BEDTIME, Disp: 16 g, Rfl: 0   loratadine (CLARITIN) 10 MG tablet, Take 10 mg by mouth at bedtime as needed for allergies. , Disp: , Rfl:    tadalafil (CIALIS) 20 MG tablet, Take 1 tablet (20 mg total) by mouth daily as needed., Disp: 30 tablet, Rfl: 0   Review of Systems Review of Systems  Constitutional:  Negative for chills and fever.  Respiratory:  Negative for cough, sputum production, shortness of breath and wheezing.   Cardiovascular:  Negative for chest pain and leg swelling.  Gastrointestinal:  Negative for abdominal pain, constipation, diarrhea, nausea and vomiting.   Musculoskeletal:  Negative for myalgias.  Skin:  Negative for itching and rash.  Neurological:  Negative for dizziness and headaches.       Objective:    Vitals BP 128/78   Pulse 94   Temp 98.8 F (37.1 C)   Resp 18   Ht 6' (1.829 m)   Wt 159 lb 3.2 oz (72.2 kg)   SpO2 99%   BMI 21.59 kg/m    Physical Examination Physical Exam Constitutional:      Appearance: Normal appearance. He is not ill-appearing.  HENT:     Right Ear: Tympanic membrane, ear canal and external ear normal.     Left Ear: Tympanic membrane, ear canal and external ear normal.     Ears:     Comments: He has minimal clear ear effusion bilaterally    Nose: Nose normal. No congestion or rhinorrhea.     Mouth/Throat:     Mouth: Mucous membranes are moist.     Pharynx: Oropharynx is clear. No oropharyngeal exudate or posterior oropharyngeal erythema.     Comments: He has clear mucus in the back of his throat. Eyes:     General: No scleral icterus.    Conjunctiva/sclera: Conjunctivae normal.     Pupils: Pupils are equal, round, and reactive to light.  Cardiovascular:  Rate and Rhythm: Normal rate and regular rhythm.     Pulses: Normal pulses.     Heart sounds: No murmur heard.    No friction rub. No gallop.  Pulmonary:     Effort: Pulmonary effort is normal. No respiratory distress.     Breath sounds: No wheezing, rhonchi or rales.  Abdominal:     General: Abdomen is flat. Bowel sounds are normal. There is no distension.     Palpations: Abdomen is soft.     Tenderness: There is no abdominal tenderness.  Musculoskeletal:     Right lower leg: No edema.     Left lower leg: No edema.  Skin:    General: Skin is warm and dry.     Findings: No rash.  Neurological:     Mental Status: He is alert.        Assessment & Plan:   Viral upper respiratory illness He has eusthachian tube dysfunction.  I do not think he has a bacterial sinus infection.  We will start him on flonase and zyrtec.  Continue  supportive care.  Dysfunction of both eustachian tubes As above.  His was negative for COVID-19 and rapid strep.    No follow-ups on file.   Crist Fat, MD

## 2023-10-01 ENCOUNTER — Ambulatory Visit: Payer: BC Managed Care – PPO | Admitting: Internal Medicine

## 2023-10-01 ENCOUNTER — Encounter: Payer: Self-pay | Admitting: Internal Medicine

## 2023-10-01 VITALS — BP 134/72 | HR 93 | Temp 97.9°F | Resp 18 | Ht 72.0 in | Wt 159.1 lb

## 2023-10-01 DIAGNOSIS — J029 Acute pharyngitis, unspecified: Secondary | ICD-10-CM | POA: Diagnosis not present

## 2023-10-01 DIAGNOSIS — H6993 Unspecified Eustachian tube disorder, bilateral: Secondary | ICD-10-CM

## 2023-10-01 LAB — POCT RAPID STREP A (OFFICE): Rapid Strep A Screen: NEGATIVE

## 2023-10-01 MED ORDER — AMOXICILLIN 500 MG PO CAPS: 500.0000 mg | ORAL_CAPSULE | Freq: Two times a day (BID) | ORAL | 0 refills | Status: AC

## 2023-10-01 NOTE — Progress Notes (Signed)
Office Visit  Subjective   Patient ID: JAESHON WIGTON   DOB: 11-10-1980   Age: 42 y.o.   MRN: [id number]   Chief Complaint Chief Complaint  Patient presents with   Sore Throat    Pt. C/o: sore throat x 4 days. Coughing up mucus, unsure color. A lot PND.     History of Present Illness MILA BIRNER is a 42 y.o. male who returns for an acute visit for an upper respiratory illness.  I saw him 3 weeks ago where he came in with a probable viral upper respiratory infection.  I started him on zyrtec and flonase at that time and he states he did improve with his "itchy throat" as well as pain in both ears with increased volume.  He states this improved but he states he began having sinus congestion 3 days ago with clear nasal discharge with post nasal drip and he now has some sore throat.  He is having sinus headaches with ringing of his ears and pressure behind his ears.  He wakes up first thing in the morning and coughs up mucus but he does not know the color.  There is no fevers, chills, myalgias, chest congestion, SOB, wheezing, or nausea, vomiting or diarrhea.  The patient currently smokes.   He states he took the flonase and zyrtec for about 7 days on his last visit 3 weeks ago and stopped it when he was getting better.     Past Medical History Past Medical History:  Diagnosis Date   Arthritis    neck   Erectile disorder 07/28/2023   GERD (gastroesophageal reflux disease)      Allergies No Known Allergies   Medications  Current Outpatient Medications:    fluticasone (FLONASE) 50 MCG/ACT nasal spray, SHAKE LIQUID AND USE 1 SPRAY IN EACH NOSTRIL IN THE MORNING AND AT BEDTIME, Disp: 16 g, Rfl: 0   fluticasone (FLONASE) 50 MCG/ACT nasal spray, Place 1 spray into both nostrils in the morning and at bedtime., Disp: 15.8 mL, Rfl: 0   loratadine (CLARITIN) 10 MG tablet, Take 10 mg by mouth at bedtime as needed for allergies. , Disp: , Rfl:    tadalafil (CIALIS) 20 MG tablet, Take 1  tablet (20 mg total) by mouth daily as needed., Disp: 30 tablet, Rfl: 0   Review of Systems Review of Systems  Constitutional:  Negative for chills and fever.  HENT:  Positive for congestion, sinus pain and sore throat.   Respiratory:  Positive for cough. Negative for shortness of breath and wheezing.   Gastrointestinal:  Negative for abdominal pain, diarrhea, nausea and vomiting.  Skin:  Negative for itching and rash.  Neurological:  Negative for dizziness, weakness and headaches.       Objective:    Vitals BP 134/72 (BP Location: Left Arm, Patient Position: Sitting)   Pulse 93   Temp 97.9 F (36.6 C) (Temporal)   Resp 18   Ht 6' (1.829 m)   Wt 159 lb 2 oz (72.2 kg)   SpO2 99%   BMI 21.58 kg/m    Physical Examination Physical Exam Constitutional:      Appearance: Normal appearance. He is not ill-appearing.  HENT:     Right Ear: A middle ear effusion is present.     Left Ear: A middle ear effusion is present.     Ears:     Comments: His TM have clear fluid behind them    Nose: Congestion and rhinorrhea present.  Mouth/Throat:     Mouth: Mucous membranes are dry.     Pharynx: Oropharynx is clear. No oropharyngeal exudate or posterior oropharyngeal erythema.  Cardiovascular:     Rate and Rhythm: Normal rate and regular rhythm.     Pulses: Normal pulses.     Heart sounds: No murmur heard.    No friction rub. No gallop.  Pulmonary:     Effort: Pulmonary effort is normal. No respiratory distress.     Breath sounds: No wheezing, rhonchi or rales.  Abdominal:     General: Abdomen is flat. Bowel sounds are normal. There is no distension.     Palpations: Abdomen is soft.     Tenderness: There is no abdominal tenderness.  Musculoskeletal:     Right lower leg: No edema.     Left lower leg: No edema.  Skin:    General: Skin is warm and dry.     Findings: No rash.  Neurological:     Mental Status: He is alert.        Assessment & Plan:   Dysfunction of both  eustachian tubes He has fluid behind both ears.  He could have a viral or allergy etiology going on with his symptoms.  I offered to give him a shot of kenalog and he wants to wait on this.  I want him to go back to zyrtec and flonase and stay on them.  We did a rapid strep and this was negative.    No follow-ups on file.   Crist Fat, MD

## 2023-10-01 NOTE — Assessment & Plan Note (Signed)
He has fluid behind both ears.  He could have a viral or allergy etiology going on with his symptoms.  I offered to give him a shot of kenalog and he wants to wait on this.  I want him to go back to zyrtec and flonase and stay on them.  We did a rapid strep and this was negative.

## 2023-10-02 ENCOUNTER — Other Ambulatory Visit: Payer: Self-pay | Admitting: Internal Medicine

## 2023-10-02 MED ORDER — VALACYCLOVIR HCL 1 G PO TABS: 1000.0000 mg | ORAL_TABLET | Freq: Two times a day (BID) | ORAL | 0 refills | Status: AC

## 2023-10-26 ENCOUNTER — Encounter (HOSPITAL_BASED_OUTPATIENT_CLINIC_OR_DEPARTMENT_OTHER): Payer: Self-pay

## 2023-10-26 ENCOUNTER — Ambulatory Visit (HOSPITAL_BASED_OUTPATIENT_CLINIC_OR_DEPARTMENT_OTHER)
Admission: RE | Admit: 2023-10-26 | Discharge: 2023-10-26 | Disposition: A | Discharge status: Home / Self Care | Payer: BC Managed Care – PPO | Source: Ambulatory Visit | Attending: Internal Medicine | Admitting: Internal Medicine

## 2023-10-26 VITALS — BP 131/85 | HR 108 | Temp 98.3°F | Resp 18

## 2023-10-26 DIAGNOSIS — H6993 Unspecified Eustachian tube disorder, bilateral: Secondary | ICD-10-CM

## 2023-10-26 MED ORDER — PREDNISONE 10 MG (21) PO TBPK: ORAL_TABLET | ORAL | 0 refills | Status: DC

## 2023-10-26 NOTE — ED Triage Notes (Signed)
Pt c/o bilateral ear pain feels like someone is pinching on the inside of ear.

## 2023-10-26 NOTE — Discharge Instructions (Addendum)
Continue your allergy medications. Follow-up with your doctor if your symptoms fail to improve in 1 week

## 2023-10-26 NOTE — ED Provider Notes (Signed)
[facility]    CSN: [id number] Arrival date & time: 10/26/23  1157      History   Chief Complaint Chief Complaint  Patient presents with   Ear Fullness    Internal ear pain and headache - Entered by patient    HPI Ricardo Williamson is a 42 y.o. male.    Ear Fullness Pertinent negatives include no headaches.  Bilateral ear pain for 2 to 3 days described as a pinching sensation, admits ear fullness.  Symptoms are present upon rising, get worse throughout the day.   Admits chronic nasal congestion, rhinorrhea postnasal drip States has chronic allergies takes a daily antihistamine and 2 nasal sprays.  Had strep throat in October, fluid in his years in November.  Past Medical History:  Diagnosis Date   Arthritis    neck   Erectile disorder 07/28/2023   GERD (gastroesophageal reflux disease)     Patient Active Problem List   Diagnosis Date Noted   Viral upper respiratory illness 09/11/2023   Dysfunction of both eustachian tubes 09/11/2023   Sore throat 08/13/2023   Erectile disorder 07/28/2023   Acute non-recurrent maxillary sinusitis 11/27/2022   Clubbing of nails 11/06/2022   History of alcohol abuse 11/06/2022   Allergic dermatitis 11/08/2019   Alcoholism, chronic (HCC) 11/08/2019   HNP (herniated nucleus pulposus), cervical 02/12/2018    Past Surgical History:  Procedure Laterality Date   ANTERIOR CERVICAL DECOMP/DISCECTOMY FUSION N/A 02/12/2018   Procedure: ANTERIOR CERVICAL DECOMPRESSION/DISCECTOMY FUSIONCERVICAL Alfred Levins, SIX-SEVEN;  Surgeon: Shirlean Kelly, MD;  Location: [facility] OR;  Service: Neurosurgery;  Laterality: N/A;  ANTERIOR CERVICAL DECOMPRESSION/DISCECTOMY FUSIONCERVICAL FOUR-FIVE, FIVE-SIX, SIX-SEVEN   WISDOM TOOTH EXTRACTION         Home Medications    Prior to Admission medications   Medication Sig Start Date End Date Taking? Authorizing Provider  fluticasone (FLONASE) 50 MCG/ACT nasal spray SHAKE LIQUID AND USE 1 SPRAY  IN EACH NOSTRIL IN THE MORNING AND AT BEDTIME 09/03/23   Leak, Brandi L, NP  fluticasone (FLONASE) 50 MCG/ACT nasal spray Place 1 spray into both nostrils in the morning and at bedtime. 09/11/23 09/10/24  Crist Fat, MD  loratadine (CLARITIN) 10 MG tablet Take 10 mg by mouth at bedtime as needed for allergies.     [provider]  tadalafil (CIALIS) 20 MG tablet Take 1 tablet (20 mg total) by mouth daily as needed. 08/13/23   Leak, Luetta Nutting, NP    Family History Family History  Problem Relation Age of Onset   Breast cancer Mother     Social History Social History   Tobacco Use   Smoking status: Every Day    Current packs/day: 0.25    Types: Cigarettes   Smokeless tobacco: Current    Types: Snuff  Vaping Use   Vaping status: Never Used  Substance Use Topics   Alcohol use: Yes    Alcohol/week: 21.0 standard drinks of alcohol    Types: 21 Cans of beer per week   Drug use: Never     Allergies   Patient has no known allergies.   Review of Systems Review of Systems  HENT:  Positive for congestion, ear pain, postnasal drip and rhinorrhea. Negative for ear discharge and sore throat.   Respiratory:  Negative for cough.   Gastrointestinal:  Negative for diarrhea, nausea and vomiting.  Neurological:  Negative for headaches.     Physical Exam Triage Vital Signs ED Triage Vitals  Encounter Vitals Group  BP 10/26/23 1230 131/85     Systolic BP Percentile --      Diastolic BP Percentile --      Pulse Rate 10/26/23 1230 (!) 108     Resp 10/26/23 1230 18     Temp 10/26/23 1230 98.3 F (36.8 C)     Temp src --      SpO2 10/26/23 1230 97 %     Weight --      Height --      Head Circumference --      Peak Flow --      Pain Score 10/26/23 1232 6     Pain Loc --      Pain Education --      Exclude from Growth Chart --    No data found.  Updated Vital Signs BP 131/85 (BP Location: Right Arm)   Pulse (!) 108   Temp 98.3 F (36.8 C)   Resp 18   SpO2 97%    Visual Acuity Right Eye Distance:   Left Eye Distance:   Bilateral Distance:    Right Eye Near:   Left Eye Near:    Bilateral Near:     Physical Exam Vitals and nursing note reviewed.  Constitutional:      Appearance: He is not ill-appearing.  HENT:     Head: Normocephalic and atraumatic.     Right Ear: Tympanic membrane normal.     Left Ear: Tympanic membrane and ear canal normal.     Nose: Congestion present. No rhinorrhea.     Mouth/Throat:     Mouth: Mucous membranes are moist.     Pharynx: Oropharynx is clear. No oropharyngeal exudate or posterior oropharyngeal erythema.  Eyes:     Conjunctiva/sclera: Conjunctivae normal.  Cardiovascular:     Rate and Rhythm: Normal rate and regular rhythm.     Heart sounds: Normal heart sounds.  Pulmonary:     Effort: Pulmonary effort is normal. No respiratory distress.     Breath sounds: No wheezing, rhonchi or rales.  Musculoskeletal:     Cervical back: Neck supple.  Lymphadenopathy:     Cervical: No cervical adenopathy.  Skin:    General: Skin is warm and dry.  Neurological:     Mental Status: He is alert.      UC Treatments / Results  Labs (all labs ordered are listed, but only abnormal results are displayed) Labs Reviewed - No data to display  EKG   Radiology No results found.  Procedures Procedures (including critical care time)  Medications Ordered in UC Medications - No data to display  Initial Impression / Assessment and Plan / UC Course  I have reviewed the triage vital signs and the nursing notes.  Pertinent labs & imaging results that were available during my care of the patient were reviewed by me and considered in my medical decision making (see chart for details).    42 year old male with chronic allergies presents with bilateral ear pain for 3 days He is well-appearing and afebrile, there is no evidence of bacterial infection on exam, suspect eustachian tube dysfunction recommend he continue  his allergy medications will do a short course of steroids.  Counseled to follow-up with his PCP if symptoms fail to improve or development of new symptoms Final Clinical Impressions(s) / UC Diagnoses   Final diagnoses:  None   Discharge Instructions   None    ED Prescriptions   None    PDMP not reviewed this encounter.  Meliton Rattan, [place] 10/26/23 1248

## 2023-11-18 ENCOUNTER — Other Ambulatory Visit: Payer: Self-pay

## 2023-11-18 ENCOUNTER — Ambulatory Visit: Payer: BC Managed Care – PPO | Admitting: Student

## 2023-11-18 ENCOUNTER — Encounter: Payer: Self-pay | Admitting: Student

## 2023-11-18 VITALS — BP 110/64 | HR 93 | Temp 98.3°F | Resp 18 | Ht 72.0 in | Wt 158.1 lb

## 2023-11-18 DIAGNOSIS — J029 Acute pharyngitis, unspecified: Secondary | ICD-10-CM | POA: Diagnosis not present

## 2023-11-18 DIAGNOSIS — N529 Male erectile dysfunction, unspecified: Secondary | ICD-10-CM | POA: Diagnosis not present

## 2023-11-18 LAB — POC COVID19 BINAXNOW: SARS Coronavirus 2 Ag: NEGATIVE

## 2023-11-18 LAB — POCT RAPID STREP A (OFFICE): Rapid Strep A Screen: NEGATIVE

## 2023-11-18 MED ORDER — METHYLPREDNISOLONE 4 MG PO TBPK: ORAL_TABLET | ORAL | 0 refills | Status: AC

## 2023-11-18 MED ORDER — TADALAFIL 20 MG PO TABS: 20.0000 mg | ORAL_TABLET | Freq: Every day | ORAL | 0 refills | Status: AC | PRN

## 2023-11-18 NOTE — Assessment & Plan Note (Addendum)
This appears viral in nature, mild erythema noted to bilateral tonsils without swelling. Strep and COVID-negative. He was offered Kenalog injection but refused.  He will use Medrol dose taper pack and instructed to continue cetirizine and Flonase. A referral has been placed for ENT.

## 2023-11-18 NOTE — Progress Notes (Signed)
Acute Office Visit  Subjective:     Patient ID: Ricardo Williamson, male    DOB: 12-04-1980, 43 y.o.   MRN: [id number]  Chief Complaint  Patient presents with   Sore Throat    X 3 days, sore throat. Denies fever, sinus drainage in the mornings, clear discharge, pnd during the night. Denies headaches, no sob, some coughing in the morning, congested.    HPI Patient is in today for 3 days of sore throat, coughing up clear phlegm in the mornings only. In the last 2-3 days he felt tightness in his throat where eating food was not as easy to swallow, there is no problem with fluids. In the past, the patient has been seen 5 times for viral illness, strep throat, acute maxillary sinus infections and dysfunction of both eustachian tubes. He has not been seen by ENT. Using Flonase and OTC Claritin.   Review of Systems  Constitutional: Negative.   HENT:  Positive for congestion and sore throat. Negative for ear discharge, ear pain and sinus pain.   Eyes: Negative.   Respiratory:  Positive for cough and sputum production.   Cardiovascular: Negative.   Gastrointestinal: Negative.   Skin: Negative.   Neurological: Negative.         Objective:    BP 110/64 (BP Location: Left Arm, Patient Position: Sitting)   Pulse 93   Temp 98.3 F (36.8 C) (Temporal)   Resp 18   Ht 6' (1.829 m)   Wt 158 lb 2 oz (71.7 kg)   SpO2 96%   BMI 21.45 kg/m    Physical Exam Vitals reviewed.  Constitutional:      Appearance: He is well-developed.  HENT:     Head: Normocephalic.     Right Ear: Tympanic membrane and ear canal normal. No middle ear effusion. Tympanic membrane is not erythematous.     Left Ear: Tympanic membrane and ear canal normal.  No middle ear effusion. Tympanic membrane is not erythematous.     Mouth/Throat:     Mouth: Mucous membranes are moist. No oral lesions.     Pharynx: Oropharynx is clear. Uvula midline. Posterior oropharyngeal erythema present. No oropharyngeal exudate or  uvula swelling.     Tonsils: No tonsillar exudate or tonsillar abscesses. 1+ on the right. 1+ on the left.  Eyes:     Extraocular Movements:     Right eye: Normal extraocular motion.     Left eye: Normal extraocular motion.     Conjunctiva/sclera: Conjunctivae normal.     Pupils: Pupils are equal, round, and reactive to light.  Cardiovascular:     Rate and Rhythm: Normal rate and regular rhythm.     Heart sounds: Normal heart sounds.  Pulmonary:     Effort: Pulmonary effort is normal. No respiratory distress.     Breath sounds: Normal breath sounds. No wheezing.  Abdominal:     General: Bowel sounds are normal.     Palpations: Abdomen is soft.  Musculoskeletal:     Cervical back: Normal range of motion and neck supple.  Lymphadenopathy:     Cervical: No cervical adenopathy.  Skin:    General: Skin is warm and dry.     Capillary Refill: Capillary refill takes less than 2 seconds.  Neurological:     General: No focal deficit present.     Mental Status: He is alert.  Psychiatric:        Mood and Affect: Mood normal.  Behavior: Behavior normal.     Results for orders placed or performed in visit on 11/18/23  POC COVID-19 BinaxNow  Result Value Ref Range   SARS Coronavirus 2 Ag Negative Negative  POCT rapid strep A  Result Value Ref Range   Rapid Strep A Screen Negative Negative        Assessment & Plan:   Problem List Items Addressed This Visit     Erectile disorder   He requests refill for cialis.       Sore throat - Primary   This appears viral in nature, mild erythema noted to bilateral tonsils without swelling. Strep and COVID-negative. He was offered Kenalog injection but refused.  He will use Medrol dose taper pack and instructed to continue cetirizine and Flonase. A referral has been placed for ENT.        Relevant Orders   POC COVID-19 BinaxNow (Completed)   POCT rapid strep A (Completed)   Ambulatory referral to ENT    Meds ordered this  encounter  Medications   methylPREDNISolone (MEDROL DOSEPAK) 4 MG TBPK tablet    Sig: Take as directed    Dispense:  21 tablet    Refill:  0   tadalafil (CIALIS) 20 MG tablet    Sig: Take 1 tablet (20 mg total) by mouth daily as needed.    Dispense:  30 tablet    Refill:  0    No follow-ups on file.  Edwena Blow, NP

## 2023-11-18 NOTE — Assessment & Plan Note (Signed)
He requests refill for cialis.

## 2023-11-20 ENCOUNTER — Telehealth: Payer: Self-pay

## 2023-11-20 NOTE — Telephone Encounter (Signed)
Pt informed he needs to go to a treatment facility like [facility] who help with alcohol issues in order for him to get the medication he requested here, as we do not handle that nor prescribe that medication.

## 2023-11-24 DIAGNOSIS — R7989 Other specified abnormal findings of blood chemistry: Secondary | ICD-10-CM | POA: Diagnosis not present

## 2023-11-24 DIAGNOSIS — F1021 Alcohol dependence, in remission: Secondary | ICD-10-CM | POA: Diagnosis not present

## 2023-11-24 DIAGNOSIS — F17200 Nicotine dependence, unspecified, uncomplicated: Secondary | ICD-10-CM | POA: Diagnosis not present

## 2023-12-03 DIAGNOSIS — R519 Headache, unspecified: Secondary | ICD-10-CM | POA: Diagnosis not present

## 2023-12-08 DIAGNOSIS — F1020 Alcohol dependence, uncomplicated: Secondary | ICD-10-CM | POA: Diagnosis not present

## 2023-12-08 DIAGNOSIS — F17200 Nicotine dependence, unspecified, uncomplicated: Secondary | ICD-10-CM | POA: Diagnosis not present

## 2023-12-22 DIAGNOSIS — R7989 Other specified abnormal findings of blood chemistry: Secondary | ICD-10-CM | POA: Diagnosis not present

## 2023-12-22 DIAGNOSIS — K760 Fatty (change of) liver, not elsewhere classified: Secondary | ICD-10-CM | POA: Diagnosis not present

## 2023-12-22 DIAGNOSIS — Z13220 Encounter for screening for lipoid disorders: Secondary | ICD-10-CM | POA: Diagnosis not present

## 2023-12-22 DIAGNOSIS — F1021 Alcohol dependence, in remission: Secondary | ICD-10-CM | POA: Diagnosis not present

## 2023-12-22 DIAGNOSIS — K2100 Gastro-esophageal reflux disease with esophagitis, without bleeding: Secondary | ICD-10-CM | POA: Diagnosis not present

## 2023-12-22 DIAGNOSIS — Z0000 Encounter for general adult medical examination without abnormal findings: Secondary | ICD-10-CM | POA: Diagnosis not present

## 2023-12-30 DIAGNOSIS — J329 Chronic sinusitis, unspecified: Secondary | ICD-10-CM | POA: Diagnosis not present

## 2023-12-30 DIAGNOSIS — J343 Hypertrophy of nasal turbinates: Secondary | ICD-10-CM | POA: Diagnosis not present

## 2024-03-18 DIAGNOSIS — K2100 Gastro-esophageal reflux disease with esophagitis, without bleeding: Secondary | ICD-10-CM | POA: Diagnosis not present

## 2024-03-18 DIAGNOSIS — N529 Male erectile dysfunction, unspecified: Secondary | ICD-10-CM | POA: Diagnosis not present

## 2024-03-18 DIAGNOSIS — B351 Tinea unguium: Secondary | ICD-10-CM | POA: Diagnosis not present

## 2024-03-18 DIAGNOSIS — Z79899 Other long term (current) drug therapy: Secondary | ICD-10-CM | POA: Diagnosis not present

## 2024-04-12 DIAGNOSIS — M659 Unspecified synovitis and tenosynovitis, unspecified site: Secondary | ICD-10-CM | POA: Diagnosis not present

## 2024-07-15 DIAGNOSIS — J301 Allergic rhinitis due to pollen: Secondary | ICD-10-CM | POA: Diagnosis not present

## 2024-07-15 DIAGNOSIS — J019 Acute sinusitis, unspecified: Secondary | ICD-10-CM | POA: Diagnosis not present

## 2024-07-20 DIAGNOSIS — F17210 Nicotine dependence, cigarettes, uncomplicated: Secondary | ICD-10-CM | POA: Diagnosis not present

## 2024-07-20 DIAGNOSIS — K219 Gastro-esophageal reflux disease without esophagitis: Secondary | ICD-10-CM | POA: Diagnosis not present

## 2024-07-20 DIAGNOSIS — D649 Anemia, unspecified: Secondary | ICD-10-CM | POA: Diagnosis not present

## 2024-07-20 DIAGNOSIS — Z792 Long term (current) use of antibiotics: Secondary | ICD-10-CM | POA: Diagnosis not present

## 2024-07-20 DIAGNOSIS — R569 Unspecified convulsions: Secondary | ICD-10-CM | POA: Diagnosis not present

## 2024-07-20 DIAGNOSIS — R404 Transient alteration of awareness: Secondary | ICD-10-CM | POA: Diagnosis not present

## 2024-07-20 DIAGNOSIS — F10239 Alcohol dependence with withdrawal, unspecified: Secondary | ICD-10-CM | POA: Diagnosis not present

## 2024-07-20 DIAGNOSIS — R4182 Altered mental status, unspecified: Secondary | ICD-10-CM | POA: Diagnosis not present

## 2024-07-20 DIAGNOSIS — R410 Disorientation, unspecified: Secondary | ICD-10-CM | POA: Diagnosis not present

## 2024-07-20 DIAGNOSIS — J329 Chronic sinusitis, unspecified: Secondary | ICD-10-CM | POA: Diagnosis not present

## 2024-07-20 DIAGNOSIS — Z79899 Other long term (current) drug therapy: Secondary | ICD-10-CM | POA: Diagnosis not present

## 2024-07-20 DIAGNOSIS — E871 Hypo-osmolality and hyponatremia: Secondary | ICD-10-CM | POA: Diagnosis not present

## 2024-07-20 DIAGNOSIS — G4089 Other seizures: Secondary | ICD-10-CM | POA: Diagnosis not present

## 2024-07-20 DIAGNOSIS — R000 Tachycardia, unspecified: Secondary | ICD-10-CM | POA: Diagnosis not present

## 2024-07-21 DIAGNOSIS — K219 Gastro-esophageal reflux disease without esophagitis: Secondary | ICD-10-CM | POA: Diagnosis not present

## 2024-07-21 DIAGNOSIS — D649 Anemia, unspecified: Secondary | ICD-10-CM | POA: Diagnosis not present

## 2024-07-21 DIAGNOSIS — R4182 Altered mental status, unspecified: Secondary | ICD-10-CM | POA: Diagnosis not present

## 2024-07-21 DIAGNOSIS — G4089 Other seizures: Secondary | ICD-10-CM | POA: Diagnosis not present

## 2024-07-21 DIAGNOSIS — F10239 Alcohol dependence with withdrawal, unspecified: Secondary | ICD-10-CM | POA: Diagnosis not present

## 2024-07-21 DIAGNOSIS — F17210 Nicotine dependence, cigarettes, uncomplicated: Secondary | ICD-10-CM | POA: Diagnosis not present

## 2024-07-21 DIAGNOSIS — Z792 Long term (current) use of antibiotics: Secondary | ICD-10-CM | POA: Diagnosis not present

## 2024-07-21 DIAGNOSIS — Z79899 Other long term (current) drug therapy: Secondary | ICD-10-CM | POA: Diagnosis not present

## 2024-07-21 DIAGNOSIS — J329 Chronic sinusitis, unspecified: Secondary | ICD-10-CM | POA: Diagnosis not present

## 2024-07-21 DIAGNOSIS — E871 Hypo-osmolality and hyponatremia: Secondary | ICD-10-CM | POA: Diagnosis not present

## 2024-07-21 DIAGNOSIS — R000 Tachycardia, unspecified: Secondary | ICD-10-CM | POA: Diagnosis not present

## 2024-07-21 DIAGNOSIS — R569 Unspecified convulsions: Secondary | ICD-10-CM | POA: Diagnosis not present

## 2024-07-27 DIAGNOSIS — D509 Iron deficiency anemia, unspecified: Secondary | ICD-10-CM | POA: Diagnosis not present

## 2024-07-27 DIAGNOSIS — R7989 Other specified abnormal findings of blood chemistry: Secondary | ICD-10-CM | POA: Diagnosis not present

## 2024-07-27 DIAGNOSIS — K2100 Gastro-esophageal reflux disease with esophagitis, without bleeding: Secondary | ICD-10-CM | POA: Diagnosis not present

## 2024-07-27 DIAGNOSIS — F1021 Alcohol dependence, in remission: Secondary | ICD-10-CM | POA: Diagnosis not present

## 2024-08-05 DIAGNOSIS — D649 Anemia, unspecified: Secondary | ICD-10-CM | POA: Diagnosis not present

## 2024-08-05 DIAGNOSIS — F1021 Alcohol dependence, in remission: Secondary | ICD-10-CM | POA: Diagnosis not present

## 2024-08-11 DIAGNOSIS — D509 Iron deficiency anemia, unspecified: Secondary | ICD-10-CM | POA: Diagnosis not present

## 2024-08-13 DIAGNOSIS — K219 Gastro-esophageal reflux disease without esophagitis: Secondary | ICD-10-CM | POA: Diagnosis not present

## 2024-08-13 DIAGNOSIS — F1020 Alcohol dependence, uncomplicated: Secondary | ICD-10-CM | POA: Diagnosis not present

## 2024-08-16 DIAGNOSIS — F1020 Alcohol dependence, uncomplicated: Secondary | ICD-10-CM | POA: Diagnosis not present

## 2024-08-17 DIAGNOSIS — F1020 Alcohol dependence, uncomplicated: Secondary | ICD-10-CM | POA: Diagnosis not present

## 2024-08-18 DIAGNOSIS — F1020 Alcohol dependence, uncomplicated: Secondary | ICD-10-CM | POA: Diagnosis not present

## 2024-08-19 DIAGNOSIS — F1020 Alcohol dependence, uncomplicated: Secondary | ICD-10-CM | POA: Diagnosis not present

## 2024-08-20 DIAGNOSIS — F1020 Alcohol dependence, uncomplicated: Secondary | ICD-10-CM | POA: Diagnosis not present

## 2024-08-21 DIAGNOSIS — F1020 Alcohol dependence, uncomplicated: Secondary | ICD-10-CM | POA: Diagnosis not present

## 2024-08-23 DIAGNOSIS — F1020 Alcohol dependence, uncomplicated: Secondary | ICD-10-CM | POA: Diagnosis not present

## 2024-08-24 DIAGNOSIS — F1020 Alcohol dependence, uncomplicated: Secondary | ICD-10-CM | POA: Diagnosis not present

## 2024-08-25 DIAGNOSIS — F1020 Alcohol dependence, uncomplicated: Secondary | ICD-10-CM | POA: Diagnosis not present

## 2024-08-26 DIAGNOSIS — F1020 Alcohol dependence, uncomplicated: Secondary | ICD-10-CM | POA: Diagnosis not present

## 2024-08-27 DIAGNOSIS — F1020 Alcohol dependence, uncomplicated: Secondary | ICD-10-CM | POA: Diagnosis not present

## 2024-08-28 DIAGNOSIS — F1020 Alcohol dependence, uncomplicated: Secondary | ICD-10-CM | POA: Diagnosis not present

## 2024-08-30 DIAGNOSIS — F1020 Alcohol dependence, uncomplicated: Secondary | ICD-10-CM | POA: Diagnosis not present

## 2024-08-31 DIAGNOSIS — F1020 Alcohol dependence, uncomplicated: Secondary | ICD-10-CM | POA: Diagnosis not present

## 2024-09-07 DIAGNOSIS — F17210 Nicotine dependence, cigarettes, uncomplicated: Secondary | ICD-10-CM | POA: Diagnosis not present

## 2024-09-07 DIAGNOSIS — Z792 Long term (current) use of antibiotics: Secondary | ICD-10-CM | POA: Diagnosis not present

## 2024-09-07 DIAGNOSIS — Z79899 Other long term (current) drug therapy: Secondary | ICD-10-CM | POA: Diagnosis not present

## 2024-09-07 DIAGNOSIS — F1020 Alcohol dependence, uncomplicated: Secondary | ICD-10-CM | POA: Diagnosis not present

## 2024-09-07 DIAGNOSIS — F10920 Alcohol use, unspecified with intoxication, uncomplicated: Secondary | ICD-10-CM | POA: Diagnosis not present

## 2024-09-17 DIAGNOSIS — F1020 Alcohol dependence, uncomplicated: Secondary | ICD-10-CM | POA: Diagnosis not present

## 2024-09-30 DIAGNOSIS — D500 Iron deficiency anemia secondary to blood loss (chronic): Secondary | ICD-10-CM | POA: Diagnosis not present

## 2024-10-04 DIAGNOSIS — D649 Anemia, unspecified: Secondary | ICD-10-CM | POA: Diagnosis not present

## 2024-10-04 DIAGNOSIS — D509 Iron deficiency anemia, unspecified: Secondary | ICD-10-CM | POA: Diagnosis not present

## 2024-10-04 DIAGNOSIS — F1021 Alcohol dependence, in remission: Secondary | ICD-10-CM | POA: Diagnosis not present

## 2024-10-07 DIAGNOSIS — F1020 Alcohol dependence, uncomplicated: Secondary | ICD-10-CM | POA: Diagnosis not present

## 2024-10-11 DIAGNOSIS — Z79899 Other long term (current) drug therapy: Secondary | ICD-10-CM | POA: Diagnosis not present

## 2024-10-11 DIAGNOSIS — D500 Iron deficiency anemia secondary to blood loss (chronic): Secondary | ICD-10-CM | POA: Diagnosis not present

## 2024-10-11 DIAGNOSIS — F1010 Alcohol abuse, uncomplicated: Secondary | ICD-10-CM | POA: Diagnosis not present

## 2024-10-11 DIAGNOSIS — K219 Gastro-esophageal reflux disease without esophagitis: Secondary | ICD-10-CM | POA: Diagnosis not present

## 2024-10-11 DIAGNOSIS — F17210 Nicotine dependence, cigarettes, uncomplicated: Secondary | ICD-10-CM | POA: Diagnosis not present

## 2024-10-12 DIAGNOSIS — F1020 Alcohol dependence, uncomplicated: Secondary | ICD-10-CM | POA: Diagnosis not present
